# Patient Record
Sex: Male | Born: 1970 | Race: Black or African American | Hispanic: No | Marital: Single | Smoking: Current every day smoker
Health system: Southern US, Community
[De-identification: ages and names within clinical notes are randomized; demographics above are authoritative.]

## PROBLEM LIST (undated history)

## (undated) DIAGNOSIS — Z789 Other specified health status: Secondary | ICD-10-CM

## (undated) HISTORY — PX: ANKLE ARTHODESIS W/ ARTHROSCOPY: SUR63

## (undated) HISTORY — PX: ANKLE SURGERY: SHX546

---

## 2004-07-31 ENCOUNTER — Emergency Department (HOSPITAL_COMMUNITY): Admission: EM | Admit: 2004-07-31 | Discharge: 2004-08-01 | Payer: Self-pay | Admitting: Emergency Medicine

## 2008-07-25 ENCOUNTER — Inpatient Hospital Stay (HOSPITAL_COMMUNITY): Admission: EM | Admit: 2008-07-25 | Discharge: 2008-07-28 | Payer: Self-pay | Admitting: Emergency Medicine

## 2010-06-02 HISTORY — PX: ORIF ANKLE FRACTURE: SUR919

## 2010-09-17 LAB — APTT: aPTT: 27 seconds (ref 24–37)

## 2010-09-17 LAB — COMPREHENSIVE METABOLIC PANEL
ALT: 42 U/L (ref 0–53)
AST: 55 U/L — ABNORMAL HIGH (ref 0–37)
Albumin: 3.5 g/dL (ref 3.5–5.2)
CO2: 28 mEq/L (ref 19–32)
Calcium: 8.9 mg/dL (ref 8.4–10.5)
Chloride: 100 mEq/L (ref 96–112)
Creatinine, Ser: 0.84 mg/dL (ref 0.4–1.5)
GFR calc Af Amer: >60 mL/min (ref >60)
GFR calc non Af Amer: >60 mL/min (ref >60)
Sodium: 137 mEq/L (ref 135–145)
Total Bilirubin: 0.8 mg/dL (ref 0.3–1.2)

## 2010-09-17 LAB — CBC
MCV: 101.5 fL — ABNORMAL HIGH (ref 78.0–100.0)
Platelets: 157 10*3/uL (ref 150–400)
RBC: 3.6 MIL/uL — ABNORMAL LOW (ref 4.22–5.81)
WBC: 11.5 10*3/uL — ABNORMAL HIGH (ref 4.0–10.5)
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
## 2014-07-18 ENCOUNTER — Encounter (HOSPITAL_COMMUNITY): Payer: Self-pay | Admitting: Emergency Medicine

## 2014-07-18 ENCOUNTER — Emergency Department (HOSPITAL_COMMUNITY)
Admission: EM | Admit: 2014-07-18 | Discharge: 2014-07-19 | Disposition: A | Discharge status: Home / Self Care | Payer: Self-pay | Attending: Emergency Medicine | Admitting: Emergency Medicine

## 2014-07-18 ENCOUNTER — Emergency Department (HOSPITAL_COMMUNITY): Payer: Self-pay

## 2014-07-18 DIAGNOSIS — Y92009 Unspecified place in unspecified non-institutional (private) residence as the place of occurrence of the external cause: Secondary | ICD-10-CM | POA: Insufficient documentation

## 2014-07-18 DIAGNOSIS — S8252XA Displaced fracture of medial malleolus of left tibia, initial encounter for closed fracture: Secondary | ICD-10-CM | POA: Insufficient documentation

## 2014-07-18 DIAGNOSIS — Y998 Other external cause status: Secondary | ICD-10-CM | POA: Insufficient documentation

## 2014-07-18 DIAGNOSIS — W010XXA Fall on same level from slipping, tripping and stumbling without subsequent striking against object, initial encounter: Secondary | ICD-10-CM | POA: Insufficient documentation

## 2014-07-18 DIAGNOSIS — S9302XA Subluxation of left ankle joint, initial encounter: Secondary | ICD-10-CM | POA: Insufficient documentation

## 2014-07-18 DIAGNOSIS — Y9301 Activity, walking, marching and hiking: Secondary | ICD-10-CM | POA: Insufficient documentation

## 2014-07-18 DIAGNOSIS — Z720 Tobacco use: Secondary | ICD-10-CM | POA: Insufficient documentation

## 2014-07-18 DIAGNOSIS — S82892A Other fracture of left lower leg, initial encounter for closed fracture: Secondary | ICD-10-CM

## 2014-07-18 LAB — CBC WITH DIFFERENTIAL/PLATELET
BASOS ABS: 0 10*3/uL (ref 0.0–0.1)
Basophils Relative: 0 % (ref 0–1)
EOS ABS: 0.1 10*3/uL (ref 0.0–0.7)
EOS PCT: 1 % (ref 0–5)
HCT: 47.3 % (ref 39.0–52.0)
HEMOGLOBIN: 16.4 g/dL (ref 13.0–17.0)
LYMPHS ABS: 2.8 10*3/uL (ref 0.7–4.0)
Lymphocytes Relative: 27 % (ref 12–46)
MCH: 34.7 pg — AB (ref 26.0–34.0)
MCHC: 34.7 g/dL (ref 30.0–36.0)
MCV: 100.2 fL — ABNORMAL HIGH (ref 78.0–100.0)
Monocytes Absolute: 0.3 10*3/uL (ref 0.1–1.0)
Monocytes Relative: 3 % (ref 3–12)
NEUTROS PCT: 69 % (ref 43–77)
Neutro Abs: 7.3 10*3/uL (ref 1.7–7.7)
Platelets: 123 10*3/uL — ABNORMAL LOW (ref 150–400)
RBC: 4.72 MIL/uL (ref 4.22–5.81)
RDW: 14.4 % (ref 11.5–15.5)
WBC: 10.5 10*3/uL (ref 4.0–10.5)

## 2014-07-18 MED ORDER — MORPHINE SULFATE 4 MG/ML IJ SOLN: 4.0000 mg | Freq: Once | INTRAMUSCULAR | Status: DC
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
## 2014-07-19 ENCOUNTER — Emergency Department (HOSPITAL_COMMUNITY): Payer: Self-pay

## 2014-07-19 LAB — BASIC METABOLIC PANEL
ANION GAP: 12 (ref 5–15)
BUN: 7 mg/dL (ref 6–23)
CO2: 28 mmol/L (ref 19–32)
Calcium: 9.6 mg/dL (ref 8.4–10.5)
Chloride: 97 mmol/L (ref 96–112)
Creatinine, Ser: 0.8 mg/dL (ref 0.50–1.35)
Glucose, Bld: 92 mg/dL (ref 70–99)
POTASSIUM: 3.4 mmol/L — AB (ref 3.5–5.1)
SODIUM: 137 mmol/L (ref 135–145)

## 2014-07-19 LAB — ETHANOL: Alcohol, Ethyl (B): 309 mg/dL — ABNORMAL HIGH (ref 0–9)

## 2014-07-19 MED ORDER — OXYCODONE-ACETAMINOPHEN 5-325 MG PO TABS: 2.0000 | ORAL_TABLET | ORAL | Status: DC | PRN

## 2014-07-19 MED ORDER — ETOMIDATE 2 MG/ML IV SOLN
10.0000 mg | Freq: Once | INTRAVENOUS | Status: AC
  Administered 2014-07-19: 10 mg via INTRAVENOUS
  Filled 2014-07-19: qty 10

## 2014-07-19 NOTE — ED Notes (Signed)
Pt family called for pt. Family informed pt sleeping and they are welcome to pick pt up from ED.
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
## 2014-07-19 NOTE — Discharge Instructions (Signed)
Use crutches, do not put weight on her left leg.  Follow-up with Dr. Eulah Pont at his office either today, Wednesday or Friday.  Call the number listed above to find out best time to come to the office.  Take pain medicine as needed.  Keep leg elevated whenever possible.   Ankle Fracture A fracture is a break in a bone. The ankle joint is made up of three bones. These include the lower (distal)sections of your lower leg bones, called the tibia and fibula, along with a bone in your foot, called the talus. Depending on how bad the break is and if more than one ankle joint bone is broken, a cast or splint is used to protect and keep your injured bone from moving while it heals. Sometimes, surgery is required to help the fracture heal properly.  There are two general types of fractures:  Stable fracture. This includes a single fracture line through one bone, with no injury to ankle ligaments. A fracture of the talus that does not have any displacement (movement of the bone on either side of the fracture line) is also stable.  Unstable fracture. This includes more than one fracture line through one or more bones in the ankle joint. It also includes fractures that have displacement of the bone on either side of the fracture line. CAUSES  A direct blow to the ankle.   Quickly and severely twisting your ankle.  Trauma, such as a car accident or falling from a significant height. RISK FACTORS You may be at a higher risk of ankle fracture if:  You have certain medical conditions.  You are involved in high-impact sports.  You are involved in a high-impact car accident. SIGNS AND SYMPTOMS   Tender and swollen ankle.  Bruising around the injured ankle.  Pain on movement of the ankle.  Difficulty walking or putting weight on the ankle.  A cold foot below the site of the ankle injury. This can occur if the blood vessels passing through your injured ankle were also damaged.  Numbness in the foot  below the site of the ankle injury. DIAGNOSIS  An ankle fracture is usually diagnosed with a physical exam and X-rays. A CT scan may also be required for complex fractures. TREATMENT  Stable fractures are treated with a cast or splint and using crutches to avoid putting weight on your injured ankle. This is followed by an ankle strengthening program. Some patients require a special type of cast, depending on other medical problems they may have. Unstable fractures require surgery to ensure the bones heal properly. Your health care provider will tell you what type of fracture you have and the best treatment for your condition. HOME CARE INSTRUCTIONS   Review correct crutch use with your health care provider and use your crutches as directed. Safe use of crutches is extremely important. Misuse of crutches can cause you to fall or cause injury to nerves in your hands or armpits.  Do not put weight or pressure on the injured ankle until directed by your health care provider.  To lessen the swelling, keep the injured leg elevated while sitting or lying down.  Apply ice to the injured area:  Put ice in a plastic bag.  Place a towel between your cast and the bag.  Leave the ice on for 20 minutes, 2-3 times a day.  If you have a plaster or fiberglass cast:  Do not try to scratch the skin under the cast with any objects.  This can increase your risk of skin infection.  Check the skin around the cast every day. You may put lotion on any red or sore areas.  Keep your cast dry and clean.  If you have a plaster splint:  Wear the splint as directed.  You may loosen the elastic around the splint if your toes become numb, tingle, or turn cold or blue.  Do not put pressure on any part of your cast or splint; it may break. Rest your cast only on a pillow the first 24 hours until it is fully hardened.  Your cast or splint can be protected during bathing with a plastic bag sealed to your skin with  medical tape. Do not lower the cast or splint into water.  Take medicines as directed by your health care provider. Only take over-the-counter or prescription medicines for pain, discomfort, or fever as directed by your health care provider.  Do not drive a vehicle until your health care provider specifically tells you it is safe to do so.  If your health care provider has given you a follow-up appointment, it is very important to keep that appointment. Not keeping the appointment could result in a chronic or permanent injury, pain, and disability. If you have any problem keeping the appointment, call the facility for assistance. SEEK MEDICAL CARE IF: You develop increased swelling or discomfort. SEEK IMMEDIATE MEDICAL CARE IF:   Your cast gets damaged or breaks.  You have continued severe pain.  You develop new pain or swelling after the cast was put on.  Your skin or toenails below the injury turn blue or gray.  Your skin or toenails below the injury feel cold, numb, or have loss of sensitivity to touch.  There is a bad smell or pus draining from under the cast. MAKE SURE YOU:   Understand these instructions.  Will watch your condition.  Will get help right away if you are not doing well or get worse. Document Released: 05/16/2000 Document Revised: 05/24/2013 Document Reviewed: 12/16/2012 [facility] Patient Information 2015 [place], [place]. This information is not intended to replace advice given to you by your health care provider. Make sure you discuss any questions you have with your health care provider.  Cast or Splint Care Casts and splints support injured limbs and keep bones from moving while they heal. It is important to care for your cast or splint at home.  HOME CARE INSTRUCTIONS  Keep the cast or splint uncovered during the drying period. It can take 24 to 48 hours to dry if it is made of plaster. A fiberglass cast will dry in less than 1 hour.  Do not rest the cast  on anything harder than a pillow for the first 24 hours.  Do not put weight on your injured limb or apply pressure to the cast until your health care provider gives you permission.  Keep the cast or splint dry. Wet casts or splints can lose their shape and may not support the limb as well. A wet cast that has lost its shape can also create harmful pressure on your skin when it dries. Also, wet skin can become infected.  Cover the cast or splint with a plastic bag when bathing or when out in the rain or snow. If the cast is on the trunk of the body, take sponge baths until the cast is removed.  If your cast does become wet, dry it with a towel or a blow dryer on the cool  setting only.  Keep your cast or splint clean. Soiled casts may be wiped with a moistened cloth.  Do not place any hard or soft foreign objects under your cast or splint, such as cotton, toilet paper, lotion, or powder.  Do not try to scratch the skin under the cast with any object. The object could get stuck inside the cast. Also, scratching could lead to an infection. If itching is a problem, use a blow dryer on a cool setting to relieve discomfort.  Do not trim or cut your cast or remove padding from inside of it.  Exercise all joints next to the injury that are not immobilized by the cast or splint. For example, if you have a long leg cast, exercise the hip joint and toes. If you have an arm cast or splint, exercise the shoulder, elbow, thumb, and fingers.  Elevate your injured arm or leg on 1 or 2 pillows for the first 1 to 3 days to decrease swelling and pain.It is best if you can comfortably elevate your cast so it is higher than your heart. SEEK MEDICAL CARE IF:   Your cast or splint cracks.  Your cast or splint is too tight or too loose.  You have unbearable itching inside the cast.  Your cast becomes wet or develops a soft spot or area.  You have a bad smell coming from inside your cast.  You get an object  stuck under your cast.  Your skin around the cast becomes red or raw.  You have new pain or worsening pain after the cast has been applied. SEEK IMMEDIATE MEDICAL CARE IF:   You have fluid leaking through the cast.  You are unable to move your fingers or toes.  You have discolored (blue or white), cool, painful, or very swollen fingers or toes beyond the cast.  You have tingling or numbness around the injured area.  You have severe pain or pressure under the cast.  You have any difficulty with your breathing or have shortness of breath.  You have chest pain. Document Released: 05/16/2000 Document Revised: 03/09/2013 Document Reviewed: 11/25/2012 [facility] Patient Information 2015 [place]. This information is not intended to replace advice given to you by your health care provider. Make sure you discuss any questions you have with your health care provider.

## 2014-07-19 NOTE — ED Notes (Signed)
Pt awake alert and oriented 

## 2014-07-24 ENCOUNTER — Encounter (HOSPITAL_BASED_OUTPATIENT_CLINIC_OR_DEPARTMENT_OTHER): Payer: Self-pay | Admitting: *Deleted

## 2014-07-24 NOTE — Progress Notes (Signed)
No labs needed
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
## 2014-07-27 ENCOUNTER — Ambulatory Visit (HOSPITAL_BASED_OUTPATIENT_CLINIC_OR_DEPARTMENT_OTHER)
Admission: RE | Admit: 2014-07-27 | Discharge: 2014-07-27 | Disposition: A | Discharge status: Home / Self Care | Payer: Self-pay | Source: Ambulatory Visit | Attending: Orthopedic Surgery | Admitting: Orthopedic Surgery

## 2014-07-27 ENCOUNTER — Ambulatory Visit (HOSPITAL_BASED_OUTPATIENT_CLINIC_OR_DEPARTMENT_OTHER): Payer: Self-pay | Admitting: Anesthesiology

## 2014-07-27 ENCOUNTER — Encounter (HOSPITAL_BASED_OUTPATIENT_CLINIC_OR_DEPARTMENT_OTHER): Payer: Self-pay | Admitting: *Deleted

## 2014-07-27 ENCOUNTER — Encounter (HOSPITAL_BASED_OUTPATIENT_CLINIC_OR_DEPARTMENT_OTHER)
Admission: RE | Disposition: A | Discharge status: Home / Self Care | Payer: Self-pay | Source: Ambulatory Visit | Attending: Orthopedic Surgery

## 2014-07-27 DIAGNOSIS — Y929 Unspecified place or not applicable: Secondary | ICD-10-CM | POA: Insufficient documentation

## 2014-07-27 DIAGNOSIS — F17200 Nicotine dependence, unspecified, uncomplicated: Secondary | ICD-10-CM | POA: Insufficient documentation

## 2014-07-27 DIAGNOSIS — S8252XA Displaced fracture of medial malleolus of left tibia, initial encounter for closed fracture: Secondary | ICD-10-CM | POA: Insufficient documentation

## 2014-07-27 DIAGNOSIS — S82852D Displaced trimalleolar fracture of left lower leg, subsequent encounter for closed fracture with routine healing: Secondary | ICD-10-CM

## 2014-07-27 DIAGNOSIS — X58XXXA Exposure to other specified factors, initial encounter: Secondary | ICD-10-CM | POA: Insufficient documentation

## 2014-07-27 DIAGNOSIS — Y9389 Activity, other specified: Secondary | ICD-10-CM | POA: Insufficient documentation

## 2014-07-27 DIAGNOSIS — Y999 Unspecified external cause status: Secondary | ICD-10-CM | POA: Insufficient documentation

## 2014-07-27 DIAGNOSIS — S82853A Displaced trimalleolar fracture of unspecified lower leg, initial encounter for closed fracture: Secondary | ICD-10-CM | POA: Diagnosis present

## 2014-07-27 HISTORY — PX: ORIF ANKLE FRACTURE: SHX5408

## 2014-07-27 HISTORY — DX: Other specified health status: Z78.9

## 2014-07-27 SURGERY — OPEN REDUCTION INTERNAL FIXATION (ORIF) ANKLE FRACTURE
Anesthesia: General | Site: Ankle | Laterality: Left

## 2014-07-27 MED ORDER — ASPIRIN EC 81 MG PO TBEC: 81.0000 mg | DELAYED_RELEASE_TABLET | Freq: Every day | ORAL | Status: DC

## 2014-07-27 MED ORDER — ONDANSETRON HCL 4 MG PO TABS: 4.0000 mg | ORAL_TABLET | Freq: Three times a day (TID) | ORAL | Status: DC | PRN

## 2014-07-27 MED ORDER — ACETAMINOPHEN 500 MG PO TABS
1000.0000 mg | ORAL_TABLET | Freq: Once | ORAL | Status: AC
  Administered 2014-07-27: 1000 mg via ORAL

## 2014-07-27 MED ORDER — FENTANYL CITRATE 0.05 MG/ML IJ SOLN
INTRAMUSCULAR | Status: AC
  Filled 2014-07-27: qty 2

## 2014-07-27 MED ORDER — CEFAZOLIN SODIUM-DEXTROSE 2-3 GM-% IV SOLR
INTRAVENOUS | Status: AC
  Filled 2014-07-27: qty 50

## 2014-07-27 MED ORDER — MIDAZOLAM HCL 2 MG/2ML IJ SOLN
1.0000 mg | INTRAMUSCULAR | Status: DC | PRN
  Administered 2014-07-27: 2 mg via INTRAVENOUS

## 2014-07-27 MED ORDER — DOCUSATE SODIUM 100 MG PO CAPS: 100.0000 mg | ORAL_CAPSULE | Freq: Every day | ORAL | Status: DC | PRN

## 2014-07-27 MED ORDER — OXYCODONE-ACETAMINOPHEN 5-325 MG PO TABS: 1.0000 | ORAL_TABLET | ORAL | Status: DC | PRN

## 2014-07-27 MED ORDER — LACTATED RINGERS IV SOLN
INTRAVENOUS | Status: DC
  Administered 2014-07-27: 07:00:00 via INTRAVENOUS
  Administered 2014-07-27: 10 mL/h via INTRAVENOUS
  Administered 2014-07-27: 09:00:00 via INTRAVENOUS

## 2014-07-27 MED ORDER — DEXAMETHASONE SODIUM PHOSPHATE 10 MG/ML IJ SOLN
INTRAMUSCULAR | Status: DC | PRN
  Administered 2014-07-27: 10 mg via INTRAVENOUS

## 2014-07-27 MED ORDER — LIDOCAINE HCL (CARDIAC) 20 MG/ML IV SOLN
INTRAVENOUS | Status: DC | PRN
  Administered 2014-07-27: 60 mg via INTRAVENOUS

## 2014-07-27 MED ORDER — OXYCODONE HCL 5 MG PO TABS
ORAL_TABLET | ORAL | Status: AC
  Filled 2014-07-27: qty 1

## 2014-07-27 MED ORDER — CEFAZOLIN SODIUM-DEXTROSE 2-3 GM-% IV SOLR
2.0000 g | INTRAVENOUS | Status: AC
  Administered 2014-07-27: 2 g via INTRAVENOUS

## 2014-07-27 MED ORDER — PROPOFOL 10 MG/ML IV BOLUS
INTRAVENOUS | Status: DC | PRN
  Administered 2014-07-27: 180 mg via INTRAVENOUS

## 2014-07-27 MED ORDER — HYDRALAZINE HCL 20 MG/ML IJ SOLN
20.0000 mg | Freq: Four times a day (QID) | INTRAMUSCULAR | Status: DC | PRN
  Administered 2014-07-27: 10 mg via INTRAVENOUS

## 2014-07-27 MED ORDER — ACETAMINOPHEN 500 MG PO TABS
ORAL_TABLET | ORAL | Status: AC
  Filled 2014-07-27: qty 2

## 2014-07-27 MED ORDER — KETOROLAC TROMETHAMINE 30 MG/ML IJ SOLN: 30.0000 mg | Freq: Once | INTRAMUSCULAR | Status: DC | PRN

## 2014-07-27 MED ORDER — MIDAZOLAM HCL 2 MG/2ML IJ SOLN
INTRAMUSCULAR | Status: AC
  Filled 2014-07-27: qty 2

## 2014-07-27 MED ORDER — METHOCARBAMOL 500 MG PO TABS: 500.0000 mg | ORAL_TABLET | Freq: Four times a day (QID) | ORAL | Status: DC

## 2014-07-27 MED ORDER — DEXTROSE-NACL 5-0.45 % IV SOLN: INTRAVENOUS | Status: DC

## 2014-07-27 MED ORDER — DOCUSATE SODIUM 100 MG PO CAPS: 100.0000 mg | ORAL_CAPSULE | Freq: Two times a day (BID) | ORAL | Status: DC

## 2014-07-27 MED ORDER — PROPOFOL 10 MG/ML IV BOLUS
INTRAVENOUS | Status: AC
  Filled 2014-07-27: qty 40

## 2014-07-27 MED ORDER — FENTANYL CITRATE 0.05 MG/ML IJ SOLN
50.0000 ug | INTRAMUSCULAR | Status: DC | PRN
  Administered 2014-07-27: 100 ug via INTRAVENOUS

## 2014-07-27 MED ORDER — PROMETHAZINE HCL 25 MG/ML IJ SOLN: 6.2500 mg | INTRAMUSCULAR | Status: DC | PRN

## 2014-07-27 MED ORDER — HYDRALAZINE HCL 20 MG/ML IJ SOLN
INTRAMUSCULAR | Status: AC
  Filled 2014-07-27: qty 1

## 2014-07-27 MED ORDER — FENTANYL CITRATE 0.05 MG/ML IJ SOLN
25.0000 ug | INTRAMUSCULAR | Status: DC | PRN
  Administered 2014-07-27 (×2): 25 ug via INTRAVENOUS
  Administered 2014-07-27: 50 ug via INTRAVENOUS

## 2014-07-27 MED ORDER — CHLORHEXIDINE GLUCONATE 4 % EX LIQD: 60.0000 mL | Freq: Once | CUTANEOUS | Status: DC

## 2014-07-27 MED ORDER — OXYCODONE HCL 5 MG PO TABS
5.0000 mg | ORAL_TABLET | Freq: Once | ORAL | Status: AC
Start: 2014-07-27 — End: 2014-07-27
  Administered 2014-07-27: 5 mg via ORAL

## 2014-07-27 MED ORDER — FENTANYL CITRATE 0.05 MG/ML IJ SOLN
INTRAMUSCULAR | Status: AC
  Filled 2014-07-27: qty 4

## 2014-07-27 MED ORDER — ONDANSETRON HCL 4 MG/2ML IJ SOLN
INTRAMUSCULAR | Status: DC | PRN
  Administered 2014-07-27: 4 mg via INTRAVENOUS

## 2014-07-27 MED ORDER — FENTANYL CITRATE 0.05 MG/ML IJ SOLN
INTRAMUSCULAR | Status: DC | PRN
  Administered 2014-07-27: 25 ug via INTRAVENOUS
  Administered 2014-07-27: 50 ug via INTRAVENOUS
  Administered 2014-07-27: 25 ug via INTRAVENOUS

## 2014-07-27 SURGICAL SUPPLY — 76 items
BANDAGE ELASTIC 4 VELCRO ST LF (GAUZE/BANDAGES/DRESSINGS) ×3 IMPLANT
BANDAGE ELASTIC 6 VELCRO ST LF (GAUZE/BANDAGES/DRESSINGS) ×3 IMPLANT
BANDAGE ESMARK 6X9 LF (GAUZE/BANDAGES/DRESSINGS) ×1 IMPLANT
BIT DRILL 2.5X125 (BIT) ×2 IMPLANT
BIT DRILL 3.5X125 (BIT) IMPLANT
BIT DRILL CANN 2.7 (BIT) ×2
BIT DRILL CANN 2.7MM (BIT) ×1
BIT DRILL COUNTER SINK (DRILL) IMPLANT
BIT DRILL SRG 2.7XCANN AO CPLG (BIT) IMPLANT
BIT DRL SRG 2.7XCANN AO CPLNG (BIT) ×1
BLADE SURG 15 STRL LF DISP TIS (BLADE) ×2 IMPLANT
BLADE SURG 15 STRL SS (BLADE) ×6
BNDG CMPR 9X6 STRL LF SNTH (GAUZE/BANDAGES/DRESSINGS) ×1
BNDG COHESIVE 4X5 TAN STRL (GAUZE/BANDAGES/DRESSINGS) ×3 IMPLANT
BNDG ESMARK 6X9 LF (GAUZE/BANDAGES/DRESSINGS) ×3
CHLORAPREP W/TINT 26ML (MISCELLANEOUS) ×3 IMPLANT
CLOSURE STERI-STRIP 1/2X4 (GAUZE/BANDAGES/DRESSINGS) ×2
CLSR STERI-STRIP ANTIMIC 1/2X4 (GAUZE/BANDAGES/DRESSINGS) ×3 IMPLANT
COVER BACK TABLE 60X90IN (DRAPES) ×3 IMPLANT
CUFF TOURNIQUET SINGLE 24IN (TOURNIQUET CUFF) IMPLANT
CUFF TOURNIQUET SINGLE 34IN LL (TOURNIQUET CUFF) ×2 IMPLANT
DECANTER SPIKE VIAL GLASS SM (MISCELLANEOUS) IMPLANT
DRAPE EXTREMITY T 121X128X90 (DRAPE) ×3 IMPLANT
DRAPE OEC MINIVIEW 54X84 (DRAPES) ×3 IMPLANT
DRAPE U 20/CS (DRAPES) ×3 IMPLANT
DRAPE U-SHAPE 47X51 STRL (DRAPES) ×3 IMPLANT
DRILL BIT 3.5X125 (BIT) ×3
DRILL COUNTER SINK (DRILL) ×3
DRSG EMULSION OIL 3X3 NADH (GAUZE/BANDAGES/DRESSINGS) ×1 IMPLANT
DRSG PAD ABDOMINAL 8X10 ST (GAUZE/BANDAGES/DRESSINGS) ×4 IMPLANT
ELECT REM PT RETURN 9FT ADLT (ELECTROSURGICAL) ×3
ELECTRODE REM PT RTRN 9FT ADLT (ELECTROSURGICAL) ×1 IMPLANT
GAUZE SPONGE 4X4 12PLY STRL (GAUZE/BANDAGES/DRESSINGS) ×3 IMPLANT
GLOVE BIO SURGEON STRL SZ7.5 (GLOVE) ×3 IMPLANT
GLOVE BIO SURGEON STRL SZ8 (GLOVE) IMPLANT
GLOVE BIOGEL PI IND STRL 8 (GLOVE) ×1 IMPLANT
GLOVE BIOGEL PI INDICATOR 8 (GLOVE) ×2
GOWN STRL REUS W/ TWL LRG LVL3 (GOWN DISPOSABLE) ×3 IMPLANT
GOWN STRL REUS W/ TWL XL LVL3 (GOWN DISPOSABLE) IMPLANT
GOWN STRL REUS W/TWL LRG LVL3 (GOWN DISPOSABLE) ×9
GOWN STRL REUS W/TWL XL LVL3 (GOWN DISPOSABLE)
K-WIRE ORTHOPEDIC 1.4X150L (WIRE) ×6
KWIRE ORTHOPEDIC 1.4X150L (WIRE) IMPLANT
NEEDLE HYPO 22GX1.5 SAFETY (NEEDLE) IMPLANT
NS IRRIG 1000ML POUR BTL (IV SOLUTION) ×3 IMPLANT
PACK BASIN DAY SURGERY FS (CUSTOM PROCEDURE TRAY) ×3 IMPLANT
PAD CAST 4YDX4 CTTN HI CHSV (CAST SUPPLIES) ×1 IMPLANT
PADDING CAST COTTON 4X4 STRL (CAST SUPPLIES) ×3
PADDING CAST COTTON 6X4 STRL (CAST SUPPLIES) ×3 IMPLANT
PENCIL BUTTON HOLSTER BLD 10FT (ELECTRODE) ×3 IMPLANT
PLATE TUBULAR 8 HOLE 1/3 (Plate) ×2 IMPLANT
SCREW CANC FT 16X4X2.5XHEX (Screw) IMPLANT
SCREW CANCELLOUS 4.0X14 (Screw) ×2 IMPLANT
SCREW CANCELLOUS 4.0X16MM (Screw) ×3 IMPLANT
SCREW CANN 44X15X4XSLF DRL (Screw) IMPLANT
SCREW CANNULATED 4.0X44MM (Screw) ×6 IMPLANT
SCREW CORTEX ST MATTA 3.5X12MM (Screw) ×6 IMPLANT
SCREW CORTEX ST MATTA 3.5X16MM (Screw) ×2 IMPLANT
SCREW CORTEX ST MATTA 3.5X18MM (Screw) ×4 IMPLANT
SLEEVE SCD COMPRESS KNEE MED (MISCELLANEOUS) ×2 IMPLANT
SPLINT FAST PLASTER 5X30 (CAST SUPPLIES)
SPLINT PLASTER CAST FAST 5X30 (CAST SUPPLIES) IMPLANT
SPONGE LAP 4X18 X RAY DECT (DISPOSABLE) ×3 IMPLANT
SUCTION FRAZIER TIP 10 FR DISP (SUCTIONS) IMPLANT
SUT MON AB 2-0 CT1 36 (SUTURE) ×3 IMPLANT
SUT MON AB 4-0 PC3 18 (SUTURE) ×3 IMPLANT
SUT VIC AB 0 SH 27 (SUTURE) ×5 IMPLANT
SUT VIC AB 2-0 SH 27 (SUTURE)
SUT VIC AB 2-0 SH 27XBRD (SUTURE) IMPLANT
SYR BULB 3OZ (MISCELLANEOUS) ×3 IMPLANT
SYR CONTROL 10ML LL (SYRINGE) IMPLANT
TOWEL OR 17X24 6PK STRL BLUE (TOWEL DISPOSABLE) ×6 IMPLANT
TOWEL OR NON WOVEN STRL DISP B (DISPOSABLE) ×3 IMPLANT
TUBE CONNECTING 20'X1/4 (TUBING) ×1
TUBE CONNECTING 20X1/4 (TUBING) ×2 IMPLANT
UNDERPAD 30X30 INCONTINENT (UNDERPADS AND DIAPERS) ×3 IMPLANT

## 2014-07-27 NOTE — Transfer of Care (Signed)
Immediate Anesthesia Transfer of Care Note  Patient: Douglas Snyder  Procedure(s) Performed: Procedure(s): OPEN REDUCTION INTERNAL FIXATION (ORIF) LEFT ANKLE FRACTURE (Left)  Patient Location: PACU  Anesthesia Type:General  Level of Consciousness: sedated  Airway & Oxygen Therapy: Patient Spontanous Breathing and Patient connected to face mask oxygen  Post-op Assessment: Report given to RN and Post -op Vital signs reviewed and stable  Post vital signs: Reviewed and stable  Last Vitals:  Filed Vitals:   07/27/14 0706  BP:   Pulse: 81  Temp:   Resp: 18    Complications: No apparent anesthesia complications

## 2014-07-27 NOTE — Anesthesia Procedure Notes (Addendum)
Anesthesia Regional Block:  Popliteal block  Pre-Anesthetic Checklist: ,, timeout performed, Correct Patient, Correct Site, Correct Laterality, Correct Procedure, Correct Position, site marked, Risks and benefits discussed,  Surgical consent,  Pre-op evaluation,  At surgeon's request and post-op pain management  Laterality: Left  Prep: chloraprep       Needles:  Injection technique: Single-shot  Needle Type: Echogenic Stimulator Needle     Needle Length: 9cm 9 cm Needle Gauge: 21 and 21 G    Additional Needles:  Procedures: ultrasound guided (picture in chart) Popliteal block Narrative:  Injection made incrementally with aspirations every 5 mL.  Performed by: Personally   Additional Notes: Patient tolerated the procedure well without complications   Procedure Name: LMA Insertion Date/Time: 07/27/2014 8:31 AM Performed by: Burna CashONRAD, Christina Waldrop C Pre-anesthesia Checklist: Patient identified, Emergency Drugs available, Suction available and Patient being monitored Patient Re-evaluated:Patient Re-evaluated prior to inductionOxygen Delivery Method: Circle System Utilized Preoxygenation: Pre-oxygenation with 100% oxygen Intubation Type: IV induction Ventilation: Mask ventilation without difficulty LMA: LMA inserted LMA Size: 5.0 Number of attempts: 1 Airway Equipment and Method: Bite block Placement Confirmation: positive ETCO2 Tube secured with: Tape Dental Injury: Teeth and Oropharynx as per pre-operative assessment

## 2014-07-27 NOTE — Discharge Instructions (Signed)
Non-weight bearing in the left leg  Keep splint clean and dry till follow up appointment  Take ASA 81mg  daily for 3 weeks post op for DVT prophylaxis    Post Anesthesia Home Care Instructions  Activity: Get plenty of rest for the remainder of the day. A responsible adult should stay with you for 24 hours following the procedure.  For the next 24 hours, DO NOT: -Drive a car -Advertising copywriterperate machinery -Drink alcoholic beverages -Take any medication unless instructed by your physician -Make any legal decisions or sign important papers.  Meals: Start with liquid foods such as gelatin or soup. Progress to regular foods as tolerated. Avoid greasy, spicy, heavy foods. If nausea and/or vomiting occur, drink only clear liquids until the nausea and/or vomiting subsides. Call your physician if vomiting continues.  Special Instructions/Symptoms: Your throat may feel dry or sore from the anesthesia or the breathing tube placed in your throat during surgery. If this causes discomfort, gargle with warm salt water. The discomfort should disappear within 24 hours.  Regional Anesthesia Blocks  1. Numbness or the inability to move the "blocked" extremity may last from 3-48 hours after placement. The length of time depends on the medication injected and your individual response to the medication. If the numbness is not going away after 48 hours, call your surgeon.  2. The extremity that is blocked will need to be protected until the numbness is gone and the  Strength has returned. Because you cannot feel it, you will need to take extra care to avoid injury. Because it may be weak, you may have difficulty moving it or using it. You may not know what position it is in without looking at it while the block is in effect.  3. For blocks in the legs and feet, returning to weight bearing and walking needs to be done carefully. You will need to wait until the numbness is entirely gone and the strength has returned. You  should be able to move your leg and foot normally before you try and bear weight or walk. You will need someone to be with you when you first try to ensure you do not fall and possibly risk injury.  4. Bruising and tenderness at the needle site are common side effects and will resolve in a few days.  5. Persistent numbness or new problems with movement should be communicated to the surgeon or the [facility] [phone])/ [facility] [phone]).

## 2014-07-27 NOTE — Op Note (Signed)
07/27/2014  8:42 AM  PATIENT:  Douglas Snyder    PRE-OPERATIVE DIAGNOSIS:  LEFT ANKLE FRACTURE  POST-OPERATIVE DIAGNOSIS:  Same  PROCEDURE:  OPEN REDUCTION INTERNAL FIXATION (ORIF) LEFT ANKLE FRACTURE  SURGEON:  Abiha Lukehart, D, MD  ASSISTANT: Janalee DaneBrittney Kelly, PA-C, She was present and scrubbed throughout the case, critical for completion in a timely fashion, and for retraction, instrumentation, and closure.   ANESTHESIA:   gen and block  PREOPERATIVE INDICATIONS:  Douglas Snyder is a  44 y.o. male with a diagnosis of LEFT ANKLE FRACTURE who failed conservative measures and elected for surgical management.    The risks benefits and alternatives were discussed with the patient preoperatively including but not limited to the risks of infection, bleeding, nerve injury, cardiopulmonary complications, the need for revision surgery, among others, and the patient was willing to proceed.  OPERATIVE IMPLANTS: 1/3 tubular stainless and a 4.0 screw  OPERATIVE FINDINGS: Unstable ankle fracture. Stable syndesmosis post op  BLOOD LOSS: min  COMPLICATIONS: none  TOURNIQUET TIME: 60  OPERATIVE PROCEDURE:  Patient was identified in the preoperative holding area and site was marked by me He was transported to the operating theater and placed on the table in supine position taking care to pad all bony prominences. After a preincinduction time out anesthesia was induced. The left lower lower extremity was prepped and draped in normal sterile fashion and a pre-incision timeout was performed. Douglas Snyder received ancef for preoperative antibiotics.   I made a lateral incision of roughly 10 cm dissection was carried down sharply to the distal fibula and then spreading dissection was used proximally to protect the superficial peroneal nerve. I sharply incised the periosteum and took care to protect the peroneal tendons. I then debrided the fracture site and performed a reduction maneuver  which was held in place with a clamp.   I placed a lag screws across the fractures  I then selected a 8-hole one third tubular plate and placed in a neutralization fashion care was taken distally so as not to penetrate the joint with the cancellus screws.  I then turned my attention medially where I created a 4 cm incision and dissected sharply down to the medial Mal fracture taking care to protect the saphenous vein. I debrided the fracture and reduced and held in place with a tenaculum. I then drilled and placed 2 partially threaded 45 mm cannulated screws one anterior and one posterior across the fracture.  I then stressed the syndesmosis and it was stable.  The small posterior mal piece did not require fixation  The wound was then thoroughly irrigated and closed using a 0 Vicryl and absorbable Monocryl sutures. He was placed in a short leg splint.   POST OPERATIVE PLAN: Non-weightbearing. DVT prophylaxis will consist of ASA 81 daily  This note was generated using a template and dragon dictation system. In light of that, I have reviewed the note and all aspects of it are applicable to this case. Any dictation errors are due to the computerized dictation system.

## 2014-07-27 NOTE — Interval H&P Note (Signed)
History and Physical Interval Note:  07/27/2014 7:17 AM  Douglas Snyder  has presented today for surgery, with the diagnosis of LEFT ANKLE FRACTURE  The various methods of treatment have been discussed with the patient and family. After consideration of risks, benefits and other options for treatment, the patient has consented to  Procedure(s): OPEN REDUCTION INTERNAL FIXATION (ORIF) LEFT ANKLE FRACTURE (Left) as a surgical intervention .  The patient's history has been reviewed, patient examined, no change in status, stable for surgery.  I have reviewed the patient's chart and labs.  Questions were answered to the patient's satisfaction.     Alyssabeth Bruster, D

## 2014-07-27 NOTE — Progress Notes (Signed)
Assisted Dr. Rose with left, ultrasound guided, popliteal block. Side rails up, monitors on throughout procedure. See vital signs in flow sheet. Tolerated Procedure well. 

## 2014-07-27 NOTE — Anesthesia Postprocedure Evaluation (Signed)
  Anesthesia Post-op Note  Patient: Douglas Snyder  Procedure(s) Performed: Procedure(s) (LRB): OPEN REDUCTION INTERNAL FIXATION (ORIF) LEFT ANKLE FRACTURE (Left)  Patient Location: PACU  Anesthesia Type: GA combined with regional for post-op pain  Level of Consciousness: awake and alert   Airway and Oxygen Therapy: Patient Spontanous Breathing  Post-op Pain: mild  Post-op Assessment: Post-op Vital signs reviewed, Patient's Cardiovascular Status Stable, Respiratory Function Stable, Patent Airway and No signs of Nausea or vomiting  Last Vitals:  Filed Vitals:   07/27/14 1000  BP: 184/110  Pulse: 95  Temp:   Resp: 17    Post-op Vital Signs: stable   Complications: No apparent anesthesia complications

## 2014-07-27 NOTE — Anesthesia Preprocedure Evaluation (Addendum)
Anesthesia Evaluation  Patient identified by MRN, date of birth, ID band Patient awake    Reviewed: Allergy & Precautions, NPO status , Patient's Chart, lab work & pertinent test results  History of Anesthesia Complications Negative for: history of anesthetic complications  Airway Mallampati: II  TM Distance: >3 FB Neck ROM: Full    Dental no notable dental hx.    Pulmonary Current Smoker,  breath sounds clear to auscultation  Pulmonary exam normal       Cardiovascular negative cardio ROS  Rhythm:Regular Rate:Normal     Neuro/Psych negative neurological ROS  negative psych ROS   GI/Hepatic negative GI ROS, Neg liver ROS,   Endo/Other  negative endocrine ROS  Renal/GU negative Renal ROS  negative genitourinary   Musculoskeletal negative musculoskeletal ROS (+)   Abdominal   Peds negative pediatric ROS (+)  Hematology negative hematology ROS (+)   Anesthesia Other Findings   Reproductive/Obstetrics negative OB ROS                            Anesthesia Physical Anesthesia Plan  ASA: II  Anesthesia Plan: General   Post-op Pain Management:    Induction: Intravenous  Airway Management Planned: LMA  Additional Equipment:   Intra-op Plan:   Post-operative Plan: Extubation in OR  Informed Consent: I have reviewed the patients History and Physical, chart, labs and discussed the procedure including the risks, benefits and alternatives for the proposed anesthesia with the patient or authorized representative who has indicated his/her understanding and acceptance.   Dental advisory given  Plan Discussed with: CRNA and Surgeon  Anesthesia Plan Comments:         Anesthesia Quick Evaluation

## 2014-07-31 ENCOUNTER — Encounter (HOSPITAL_BASED_OUTPATIENT_CLINIC_OR_DEPARTMENT_OTHER): Payer: Self-pay | Admitting: Orthopedic Surgery

## 2014-07-31 LAB — POCT HEMOGLOBIN-HEMACUE: Hemoglobin: 15.7 g/dL (ref 13.0–17.0)

## 2020-09-08 ENCOUNTER — Other Ambulatory Visit: Payer: Self-pay

## 2020-09-08 ENCOUNTER — Emergency Department (HOSPITAL_COMMUNITY): Payer: No Typology Code available for payment source

## 2020-09-08 ENCOUNTER — Encounter (HOSPITAL_COMMUNITY): Payer: Self-pay

## 2020-09-08 ENCOUNTER — Inpatient Hospital Stay (HOSPITAL_COMMUNITY)
Admission: EM | Admit: 2020-09-08 | Discharge: 2020-09-13 | DRG: 481 | Disposition: A | Discharge status: Home / Self Care | Payer: No Typology Code available for payment source | Attending: General Surgery | Admitting: General Surgery

## 2020-09-08 DIAGNOSIS — D62 Acute posthemorrhagic anemia: Secondary | ICD-10-CM | POA: Diagnosis not present

## 2020-09-08 DIAGNOSIS — M549 Dorsalgia, unspecified: Secondary | ICD-10-CM | POA: Diagnosis not present

## 2020-09-08 DIAGNOSIS — R52 Pain, unspecified: Secondary | ICD-10-CM

## 2020-09-08 DIAGNOSIS — Z23 Encounter for immunization: Secondary | ICD-10-CM

## 2020-09-08 DIAGNOSIS — F17210 Nicotine dependence, cigarettes, uncomplicated: Secondary | ICD-10-CM | POA: Diagnosis present

## 2020-09-08 DIAGNOSIS — S7290XA Unspecified fracture of unspecified femur, initial encounter for closed fracture: Secondary | ICD-10-CM | POA: Diagnosis present

## 2020-09-08 DIAGNOSIS — Z20822 Contact with and (suspected) exposure to covid-19: Secondary | ICD-10-CM | POA: Diagnosis present

## 2020-09-08 DIAGNOSIS — D696 Thrombocytopenia, unspecified: Secondary | ICD-10-CM | POA: Diagnosis not present

## 2020-09-08 DIAGNOSIS — T148XXA Other injury of unspecified body region, initial encounter: Secondary | ICD-10-CM

## 2020-09-08 DIAGNOSIS — S2243XS Multiple fractures of ribs, bilateral, sequela: Secondary | ICD-10-CM

## 2020-09-08 DIAGNOSIS — R413 Other amnesia: Secondary | ICD-10-CM | POA: Diagnosis present

## 2020-09-08 DIAGNOSIS — S2243XA Multiple fractures of ribs, bilateral, initial encounter for closed fracture: Secondary | ICD-10-CM | POA: Diagnosis present

## 2020-09-08 DIAGNOSIS — S72352A Displaced comminuted fracture of shaft of left femur, initial encounter for closed fracture: Secondary | ICD-10-CM

## 2020-09-08 DIAGNOSIS — S7222XA Displaced subtrochanteric fracture of left femur, initial encounter for closed fracture: Principal | ICD-10-CM | POA: Diagnosis present

## 2020-09-08 DIAGNOSIS — Y92410 Unspecified street and highway as the place of occurrence of the external cause: Secondary | ICD-10-CM

## 2020-09-08 DIAGNOSIS — T1490XA Injury, unspecified, initial encounter: Secondary | ICD-10-CM

## 2020-09-08 LAB — COMPREHENSIVE METABOLIC PANEL
ALT: 50 U/L — ABNORMAL HIGH (ref 0–44)
AST: 119 U/L — ABNORMAL HIGH (ref 15–41)
Albumin: 3.7 g/dL (ref 3.5–5.0)
Alkaline Phosphatase: 56 U/L (ref 38–126)
Anion gap: 12 (ref 5–15)
BUN: 7 mg/dL (ref 6–20)
CO2: 25 mmol/L (ref 22–32)
Calcium: 8.8 mg/dL — ABNORMAL LOW (ref 8.9–10.3)
Chloride: 101 mmol/L (ref 98–111)
Creatinine, Ser: 0.98 mg/dL (ref 0.61–1.24)
GFR, Estimated: >60 mL/min (ref >60)
Glucose, Bld: 107 mg/dL — ABNORMAL HIGH (ref 70–99)
Potassium: 3.8 mmol/L (ref 3.5–5.1)
Sodium: 138 mmol/L (ref 135–145)
Total Bilirubin: 0.9 mg/dL (ref 0.3–1.2)
Total Protein: 7 g/dL (ref 6.5–8.1)

## 2020-09-08 LAB — RESP PANEL BY RT-PCR (FLU A&B, COVID) ARPGX2
Influenza A by PCR: NEGATIVE
Influenza B by PCR: NEGATIVE
SARS Coronavirus 2 by RT PCR: NEGATIVE

## 2020-09-08 LAB — TYPE AND SCREEN
ABO/RH(D): A POS
Antibody Screen: NEGATIVE

## 2020-09-08 LAB — CBC
HCT: 46.7 % (ref 39.0–52.0)
Hemoglobin: 16.3 g/dL (ref 13.0–17.0)
MCH: 36.1 pg — ABNORMAL HIGH (ref 26.0–34.0)
MCHC: 34.9 g/dL (ref 30.0–36.0)
MCV: 103.5 fL — ABNORMAL HIGH (ref 80.0–100.0)
Platelets: 138 10*3/uL — ABNORMAL LOW (ref 150–400)
RBC: 4.51 MIL/uL (ref 4.22–5.81)
RDW: 12.8 % (ref 11.5–15.5)
WBC: 12.2 10*3/uL — ABNORMAL HIGH (ref 4.0–10.5)
nRBC: 0 % (ref 0.0–0.2)

## 2020-09-08 LAB — I-STAT CHEM 8, ED
BUN: 7 mg/dL (ref 6–20)
Calcium, Ion: 1.05 mmol/L — ABNORMAL LOW (ref 1.15–1.40)
Chloride: 99 mmol/L (ref 98–111)
Creatinine, Ser: 1.3 mg/dL — ABNORMAL HIGH (ref 0.61–1.24)
Glucose, Bld: 100 mg/dL — ABNORMAL HIGH (ref 70–99)
HCT: 51 % (ref 39.0–52.0)
Hemoglobin: 17.3 g/dL — ABNORMAL HIGH (ref 13.0–17.0)
Potassium: 3.7 mmol/L (ref 3.5–5.1)
Sodium: 139 mmol/L (ref 135–145)
TCO2: 26 mmol/L (ref 22–32)

## 2020-09-08 LAB — PROTIME-INR
INR: 1.1 (ref 0.8–1.2)
Prothrombin Time: 14.2 seconds (ref 11.4–15.2)

## 2020-09-08 LAB — LACTIC ACID, PLASMA: Lactic Acid, Venous: 3.3 mmol/L (ref 0.5–1.9)

## 2020-09-08 LAB — ETHANOL: Alcohol, Ethyl (B): 349 mg/dL (ref <10)

## 2020-09-08 MED ORDER — TETANUS-DIPHTH-ACELL PERTUSSIS 5-2.5-18.5 LF-MCG/0.5 IM SUSY
0.5000 mL | PREFILLED_SYRINGE | Freq: Once | INTRAMUSCULAR | Status: AC
  Administered 2020-09-08: 0.5 mL via INTRAMUSCULAR
  Filled 2020-09-08: qty 0.5

## 2020-09-08 MED ORDER — SODIUM CHLORIDE 0.9% FLUSH
3.0000 mL | Freq: Two times a day (BID) | INTRAVENOUS | Status: DC
  Administered 2020-09-08: 3 mL via INTRAVENOUS

## 2020-09-08 MED ORDER — SODIUM CHLORIDE 0.9 % IV SOLN: 250.0000 mL | INTRAVENOUS | Status: DC | PRN

## 2020-09-08 MED ORDER — SODIUM CHLORIDE 0.9 % IV BOLUS
1000.0000 mL | Freq: Once | INTRAVENOUS | Status: AC
  Administered 2020-09-08: 1000 mL via INTRAVENOUS

## 2020-09-08 MED ORDER — FENTANYL CITRATE (PF) 100 MCG/2ML IJ SOLN
50.0000 ug | Freq: Once | INTRAMUSCULAR | Status: AC
  Administered 2020-09-08: 50 ug via INTRAVENOUS
  Filled 2020-09-08: qty 2

## 2020-09-08 MED ORDER — ONDANSETRON HCL 4 MG/2ML IJ SOLN
4.0000 mg | Freq: Once | INTRAMUSCULAR | Status: AC
  Administered 2020-09-08: 4 mg via INTRAVENOUS
  Filled 2020-09-08: qty 2

## 2020-09-08 MED ORDER — SODIUM CHLORIDE 0.9% FLUSH: 3.0000 mL | INTRAVENOUS | Status: DC | PRN

## 2020-09-08 MED ORDER — IOHEXOL 300 MG/ML  SOLN
100.0000 mL | Freq: Once | INTRAMUSCULAR | Status: AC | PRN
  Administered 2020-09-08: 100 mL via INTRAVENOUS
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
## 2020-09-08 NOTE — ED Notes (Signed)
Pa aware of critical lactic and etoh

## 2020-09-08 NOTE — ED Notes (Signed)
Patient transported to CT on ccm

## 2020-09-08 NOTE — ED Notes (Signed)
Pt back from CT

## 2020-09-08 NOTE — Progress Notes (Signed)
Called by EDP regarding left femur injury.  Incomplete imaging at this time.  Will review once all scans and work up are complete.  Have tentatively posted for surgery tomorrow for left femur IMN.  NPO tonight at [facility]
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
## 2020-09-09 ENCOUNTER — Other Ambulatory Visit: Payer: Self-pay

## 2020-09-09 ENCOUNTER — Inpatient Hospital Stay (HOSPITAL_COMMUNITY): Payer: No Typology Code available for payment source | Admitting: Certified Registered"

## 2020-09-09 ENCOUNTER — Encounter (HOSPITAL_COMMUNITY): Admission: EM | Disposition: A | Discharge status: Home / Self Care | Payer: Self-pay | Source: Home / Self Care

## 2020-09-09 ENCOUNTER — Inpatient Hospital Stay (HOSPITAL_COMMUNITY): Payer: No Typology Code available for payment source

## 2020-09-09 ENCOUNTER — Encounter (HOSPITAL_COMMUNITY): Payer: Self-pay

## 2020-09-09 DIAGNOSIS — D62 Acute posthemorrhagic anemia: Secondary | ICD-10-CM | POA: Diagnosis not present

## 2020-09-09 DIAGNOSIS — R413 Other amnesia: Secondary | ICD-10-CM | POA: Diagnosis present

## 2020-09-09 DIAGNOSIS — S2243XA Multiple fractures of ribs, bilateral, initial encounter for closed fracture: Secondary | ICD-10-CM | POA: Diagnosis present

## 2020-09-09 DIAGNOSIS — S7222XA Displaced subtrochanteric fracture of left femur, initial encounter for closed fracture: Secondary | ICD-10-CM | POA: Diagnosis present

## 2020-09-09 DIAGNOSIS — S7290XA Unspecified fracture of unspecified femur, initial encounter for closed fracture: Secondary | ICD-10-CM | POA: Diagnosis present

## 2020-09-09 DIAGNOSIS — D696 Thrombocytopenia, unspecified: Secondary | ICD-10-CM | POA: Diagnosis not present

## 2020-09-09 DIAGNOSIS — M549 Dorsalgia, unspecified: Secondary | ICD-10-CM | POA: Diagnosis present

## 2020-09-09 DIAGNOSIS — F17210 Nicotine dependence, cigarettes, uncomplicated: Secondary | ICD-10-CM | POA: Diagnosis present

## 2020-09-09 DIAGNOSIS — Z23 Encounter for immunization: Secondary | ICD-10-CM | POA: Diagnosis not present

## 2020-09-09 DIAGNOSIS — Z20822 Contact with and (suspected) exposure to covid-19: Secondary | ICD-10-CM | POA: Diagnosis present

## 2020-09-09 DIAGNOSIS — Y92410 Unspecified street and highway as the place of occurrence of the external cause: Secondary | ICD-10-CM | POA: Diagnosis not present

## 2020-09-09 HISTORY — PX: FEMUR IM NAIL: SHX1597

## 2020-09-09 LAB — RAPID URINE DRUG SCREEN, HOSP PERFORMED
Amphetamines: NOT DETECTED
Barbiturates: NOT DETECTED
Benzodiazepines: NOT DETECTED
Cocaine: NOT DETECTED
Opiates: NOT DETECTED
Tetrahydrocannabinol: POSITIVE — AB

## 2020-09-09 LAB — BASIC METABOLIC PANEL
Anion gap: 15 (ref 5–15)
BUN: 6 mg/dL (ref 6–20)
CO2: 20 mmol/L — ABNORMAL LOW (ref 22–32)
Calcium: 8.1 mg/dL — ABNORMAL LOW (ref 8.9–10.3)
Chloride: 102 mmol/L (ref 98–111)
Creatinine, Ser: 0.75 mg/dL (ref 0.61–1.24)
GFR, Estimated: >60 mL/min (ref >60)
Glucose, Bld: 103 mg/dL — ABNORMAL HIGH (ref 70–99)
Potassium: 4 mmol/L (ref 3.5–5.1)
Sodium: 137 mmol/L (ref 135–145)

## 2020-09-09 LAB — URINALYSIS, ROUTINE W REFLEX MICROSCOPIC
Bacteria, UA: NONE SEEN
Bilirubin Urine: NEGATIVE
Glucose, UA: NEGATIVE mg/dL
Ketones, ur: NEGATIVE mg/dL
Leukocytes,Ua: NEGATIVE
Nitrite: NEGATIVE
Protein, ur: NEGATIVE mg/dL
Specific Gravity, Urine: 1.034 — ABNORMAL HIGH (ref 1.005–1.030)
pH: 5 (ref 5.0–8.0)

## 2020-09-09 LAB — CBC
HCT: 42.5 % (ref 39.0–52.0)
Hemoglobin: 14.9 g/dL (ref 13.0–17.0)
MCH: 36 pg — ABNORMAL HIGH (ref 26.0–34.0)
MCHC: 35.1 g/dL (ref 30.0–36.0)
MCV: 102.7 fL — ABNORMAL HIGH (ref 80.0–100.0)
Platelets: 116 10*3/uL — ABNORMAL LOW (ref 150–400)
RBC: 4.14 MIL/uL — ABNORMAL LOW (ref 4.22–5.81)
RDW: 13 % (ref 11.5–15.5)
WBC: 11.9 10*3/uL — ABNORMAL HIGH (ref 4.0–10.5)
nRBC: 0 % (ref 0.0–0.2)

## 2020-09-09 LAB — ABO/RH: ABO/RH(D): A POS

## 2020-09-09 LAB — MRSA PCR SCREENING: MRSA by PCR: NEGATIVE

## 2020-09-09 LAB — HIV ANTIBODY (ROUTINE TESTING W REFLEX): HIV Screen 4th Generation wRfx: NONREACTIVE

## 2020-09-09 SURGERY — INSERTION, INTRAMEDULLARY ROD, FEMUR
Anesthesia: General | Laterality: Left

## 2020-09-09 MED ORDER — METOCLOPRAMIDE HCL 5 MG/ML IJ SOLN: 5.0000 mg | Freq: Three times a day (TID) | INTRAMUSCULAR | Status: DC | PRN

## 2020-09-09 MED ORDER — HYDROMORPHONE HCL 1 MG/ML IJ SOLN
0.5000 mg | INTRAMUSCULAR | Status: DC | PRN
  Administered 2020-09-09 – 2020-09-10 (×2): 0.5 mg via INTRAVENOUS
  Filled 2020-09-09 (×2): qty 1

## 2020-09-09 MED ORDER — SUFENTANIL CITRATE 50 MCG/ML IV SOLN
INTRAVENOUS | Status: DC | PRN
  Administered 2020-09-09: 10 ug via INTRAVENOUS

## 2020-09-09 MED ORDER — SUGAMMADEX SODIUM 200 MG/2ML IV SOLN
INTRAVENOUS | Status: DC | PRN
  Administered 2020-09-09: 200 mg via INTRAVENOUS

## 2020-09-09 MED ORDER — METHOCARBAMOL 500 MG PO TABS
500.0000 mg | ORAL_TABLET | Freq: Three times a day (TID) | ORAL | Status: DC
  Administered 2020-09-09: 500 mg via ORAL
  Filled 2020-09-09: qty 1

## 2020-09-09 MED ORDER — OXYCODONE HCL 5 MG/5ML PO SOLN: 5.0000 mg | Freq: Once | ORAL | Status: DC | PRN

## 2020-09-09 MED ORDER — OXYCODONE HCL 5 MG PO TABS
5.0000 mg | ORAL_TABLET | Freq: Four times a day (QID) | ORAL | Status: DC | PRN
  Administered 2020-09-09 – 2020-09-10 (×3): 10 mg via ORAL
  Filled 2020-09-09 (×3): qty 2

## 2020-09-09 MED ORDER — LACTATED RINGERS IV SOLN: INTRAVENOUS | Status: DC

## 2020-09-09 MED ORDER — ACETAMINOPHEN 500 MG PO TABS: 1000.0000 mg | ORAL_TABLET | Freq: Once | ORAL | Status: DC | PRN

## 2020-09-09 MED ORDER — ACETAMINOPHEN 10 MG/ML IV SOLN
INTRAVENOUS | Status: DC | PRN
  Administered 2020-09-09: 1000 mg via INTRAVENOUS

## 2020-09-09 MED ORDER — CHLORHEXIDINE GLUCONATE 0.12 % MT SOLN
OROMUCOSAL | Status: AC
  Administered 2020-09-09: 15 mL via OROMUCOSAL
  Filled 2020-09-09: qty 15

## 2020-09-09 MED ORDER — PHENYLEPHRINE HCL (PRESSORS) 10 MG/ML IV SOLN
INTRAVENOUS | Status: DC | PRN
  Administered 2020-09-09: 80 ug via INTRAVENOUS

## 2020-09-09 MED ORDER — CEFAZOLIN SODIUM-DEXTROSE 2-4 GM/100ML-% IV SOLN
2.0000 g | INTRAVENOUS | Status: AC
  Administered 2020-09-09: 2 g via INTRAVENOUS
  Filled 2020-09-09: qty 100

## 2020-09-09 MED ORDER — ONDANSETRON HCL 4 MG/2ML IJ SOLN: 4.0000 mg | Freq: Four times a day (QID) | INTRAMUSCULAR | Status: DC | PRN

## 2020-09-09 MED ORDER — 0.9 % SODIUM CHLORIDE (POUR BTL) OPTIME
TOPICAL | Status: DC | PRN
  Administered 2020-09-09: 1000 mL

## 2020-09-09 MED ORDER — TRANEXAMIC ACID-NACL 1000-0.7 MG/100ML-% IV SOLN
1000.0000 mg | INTRAVENOUS | Status: AC
  Administered 2020-09-09: 1000 mg via INTRAVENOUS
  Filled 2020-09-09: qty 100

## 2020-09-09 MED ORDER — DEXAMETHASONE SODIUM PHOSPHATE 10 MG/ML IJ SOLN
INTRAMUSCULAR | Status: DC | PRN
  Administered 2020-09-09: 10 mg via INTRAVENOUS

## 2020-09-09 MED ORDER — HYDROMORPHONE HCL 1 MG/ML IJ SOLN
0.5000 mg | INTRAMUSCULAR | Status: DC | PRN
  Administered 2020-09-09: 0.5 mg via INTRAVENOUS
  Filled 2020-09-09: qty 1

## 2020-09-09 MED ORDER — ACETAMINOPHEN 325 MG PO TABS: 650.0000 mg | ORAL_TABLET | Freq: Four times a day (QID) | ORAL | Status: DC | PRN

## 2020-09-09 MED ORDER — MIDAZOLAM HCL 2 MG/2ML IJ SOLN
INTRAMUSCULAR | Status: DC | PRN
  Administered 2020-09-09: 2 mg via INTRAVENOUS

## 2020-09-09 MED ORDER — FENTANYL CITRATE (PF) 100 MCG/2ML IJ SOLN
25.0000 ug | INTRAMUSCULAR | Status: DC | PRN
  Administered 2020-09-09 (×2): 50 ug via INTRAVENOUS

## 2020-09-09 MED ORDER — OXYCODONE HCL 5 MG PO TABS
5.0000 mg | ORAL_TABLET | ORAL | Status: DC | PRN
Start: 2020-09-09 — End: 2020-09-09

## 2020-09-09 MED ORDER — ONDANSETRON 4 MG PO TBDP: 4.0000 mg | ORAL_TABLET | Freq: Four times a day (QID) | ORAL | Status: DC | PRN

## 2020-09-09 MED ORDER — LIDOCAINE HCL (CARDIAC) PF 100 MG/5ML IV SOSY
PREFILLED_SYRINGE | INTRAVENOUS | Status: DC | PRN
  Administered 2020-09-09: 60 mg via INTRAVENOUS

## 2020-09-09 MED ORDER — ACETAMINOPHEN 160 MG/5ML PO SOLN: 1000.0000 mg | Freq: Once | ORAL | Status: DC | PRN

## 2020-09-09 MED ORDER — POVIDONE-IODINE 10 % EX SWAB
2.0000 "application " | Freq: Once | CUTANEOUS | Status: AC
  Administered 2020-09-09: 2 via TOPICAL

## 2020-09-09 MED ORDER — DOCUSATE SODIUM 100 MG PO CAPS: 100.0000 mg | ORAL_CAPSULE | Freq: Two times a day (BID) | ORAL | Status: DC

## 2020-09-09 MED ORDER — ACETAMINOPHEN 10 MG/ML IV SOLN: 1000.0000 mg | Freq: Once | INTRAVENOUS | Status: DC | PRN

## 2020-09-09 MED ORDER — ENOXAPARIN SODIUM 30 MG/0.3ML ~~LOC~~ SOLN: 30.0000 mg | Freq: Two times a day (BID) | SUBCUTANEOUS | Status: DC

## 2020-09-09 MED ORDER — SUFENTANIL CITRATE 50 MCG/ML IV SOLN
INTRAVENOUS | Status: AC
  Filled 2020-09-09: qty 1

## 2020-09-09 MED ORDER — KETOROLAC TROMETHAMINE 30 MG/ML IJ SOLN
INTRAMUSCULAR | Status: DC | PRN
  Administered 2020-09-09: 30 mg via INTRAVENOUS

## 2020-09-09 MED ORDER — METOCLOPRAMIDE HCL 10 MG PO TABS: 5.0000 mg | ORAL_TABLET | Freq: Three times a day (TID) | ORAL | Status: DC | PRN

## 2020-09-09 MED ORDER — CHLORHEXIDINE GLUCONATE 0.12 % MT SOLN: 15.0000 mL | Freq: Once | OROMUCOSAL | Status: AC

## 2020-09-09 MED ORDER — ENSURE PRE-SURGERY PO LIQD
296.0000 mL | Freq: Once | ORAL | Status: AC
  Administered 2020-09-09: 296 mL via ORAL
  Filled 2020-09-09: qty 296

## 2020-09-09 MED ORDER — LACTATED RINGERS IV SOLN: INTRAVENOUS | Status: DC | PRN

## 2020-09-09 MED ORDER — ORAL CARE MOUTH RINSE: 15.0000 mL | Freq: Once | OROMUCOSAL | Status: AC

## 2020-09-09 MED ORDER — PHENYLEPHRINE HCL-NACL 10-0.9 MG/250ML-% IV SOLN
INTRAVENOUS | Status: DC | PRN
  Administered 2020-09-09: 30 ug/min via INTRAVENOUS

## 2020-09-09 MED ORDER — PROPOFOL 10 MG/ML IV BOLUS
INTRAVENOUS | Status: DC | PRN
  Administered 2020-09-09: 110 mg via INTRAVENOUS

## 2020-09-09 MED ORDER — FENTANYL CITRATE (PF) 100 MCG/2ML IJ SOLN
INTRAMUSCULAR | Status: AC
  Filled 2020-09-09: qty 2

## 2020-09-09 MED ORDER — OXYCODONE HCL 5 MG PO TABS: 5.0000 mg | ORAL_TABLET | Freq: Once | ORAL | Status: DC | PRN

## 2020-09-09 MED ORDER — MIDAZOLAM HCL 2 MG/2ML IJ SOLN
INTRAMUSCULAR | Status: AC
  Filled 2020-09-09: qty 2

## 2020-09-09 MED ORDER — ROCURONIUM 10MG/ML (10ML) SYRINGE FOR MEDFUSION PUMP - OPTIME
INTRAVENOUS | Status: DC | PRN
  Administered 2020-09-09: 70 mg via INTRAVENOUS

## 2020-09-09 MED ORDER — PROPOFOL 10 MG/ML IV BOLUS
INTRAVENOUS | Status: AC
  Filled 2020-09-09: qty 20

## 2020-09-09 MED ORDER — ONDANSETRON HCL 4 MG PO TABS: 4.0000 mg | ORAL_TABLET | Freq: Four times a day (QID) | ORAL | Status: DC | PRN

## 2020-09-09 MED ORDER — DOCUSATE SODIUM 100 MG PO CAPS
100.0000 mg | ORAL_CAPSULE | Freq: Two times a day (BID) | ORAL | Status: DC
  Administered 2020-09-09 – 2020-09-13 (×8): 100 mg via ORAL
  Filled 2020-09-09 (×7): qty 1

## 2020-09-09 MED ORDER — METOPROLOL TARTRATE 5 MG/5ML IV SOLN: 5.0000 mg | Freq: Four times a day (QID) | INTRAVENOUS | Status: DC | PRN

## 2020-09-09 MED ORDER — CEFAZOLIN SODIUM-DEXTROSE 1-4 GM/50ML-% IV SOLN
1.0000 g | Freq: Four times a day (QID) | INTRAVENOUS | Status: AC
  Administered 2020-09-09 – 2020-09-10 (×3): 1 g via INTRAVENOUS
  Filled 2020-09-09 (×3): qty 50

## 2020-09-09 MED ORDER — ALBUMIN HUMAN 5 % IV SOLN: INTRAVENOUS | Status: DC | PRN

## 2020-09-09 MED ORDER — ONDANSETRON HCL 4 MG/2ML IJ SOLN
INTRAMUSCULAR | Status: DC | PRN
  Administered 2020-09-09: 4 mg via INTRAVENOUS

## 2020-09-09 MED ORDER — CHLORHEXIDINE GLUCONATE 4 % EX LIQD: 60.0000 mL | Freq: Once | CUTANEOUS | Status: DC

## 2020-09-09 SURGICAL SUPPLY — 37 items
ALCOHOL 70% 16 OZ (MISCELLANEOUS) ×2 IMPLANT
BIT DRILL CANN FLEX 14 (BIT) ×1 IMPLANT
BIT DRILL SHORT 4.2 (BIT) IMPLANT
BIT DRILL STEP 4.5X6.5 (BIT) IMPLANT
BNDG COHESIVE 4X5 TAN STRL (GAUZE/BANDAGES/DRESSINGS) ×2 IMPLANT
BNDG COHESIVE 6X5 TAN STRL LF (GAUZE/BANDAGES/DRESSINGS) ×2 IMPLANT
COVER PERINEAL POST (MISCELLANEOUS) ×2 IMPLANT
COVER SURGICAL LIGHT HANDLE (MISCELLANEOUS) ×2 IMPLANT
DRAPE C-ARM 42X72 X-RAY (DRAPES) ×1 IMPLANT
DRAPE C-ARMOR (DRAPES) ×1 IMPLANT
DRAPE INCISE IOBAN 66X45 STRL (DRAPES) ×1 IMPLANT
DRAPE STERI IOBAN 125X83 (DRAPES) ×4 IMPLANT
DRILL BIT SHORT 4.2 (BIT) ×4
DRILL BIT STEP 4.5X6.5 (BIT) ×2
DRSG ADAPTIC 3X8 NADH LF (GAUZE/BANDAGES/DRESSINGS) ×1 IMPLANT
DRSG TEGADERM 2-3/8X2-3/4 SM (GAUZE/BANDAGES/DRESSINGS) ×1 IMPLANT
DURAPREP 26ML APPLICATOR (WOUND CARE) ×2 IMPLANT
GLOVE BIO SURGEON STRL SZ7.5 (GLOVE) ×6 IMPLANT
GLOVE SRG 8 PF TXTR STRL LF DI (GLOVE) ×2 IMPLANT
GLOVE SURG UNDER POLY LF SZ8 (GLOVE) ×4
GOWN STRL REUS W/ TWL XL LVL3 (GOWN DISPOSABLE) ×1 IMPLANT
GOWN STRL REUS W/TWL XL LVL3 (GOWN DISPOSABLE) ×6
GUIDEWIRE 3.2X400 (WIRE) ×3 IMPLANT
KIT BASIN OR (CUSTOM PROCEDURE TRAY) ×2 IMPLANT
KIT TURNOVER KIT B (KITS) ×2 IMPLANT
NAIL CANN FRN TI 9X360 LT (Nail) ×1 IMPLANT
NS IRRIG 1000ML POUR BTL (IV SOLUTION) ×2 IMPLANT
PACK GENERAL/GYN (CUSTOM PROCEDURE TRAY) ×2 IMPLANT
PAD ARMBOARD 7.5X6 YLW CONV (MISCELLANEOUS) ×4 IMPLANT
REAMER ROD DEEP FLUTE 2.5X950 (INSTRUMENTS) ×1 IMPLANT
SCREW LOCK HDLS 5X44 (Screw) ×1 IMPLANT
SCREW LOCK HDLS 5X50 (Screw) ×1 IMPLANT
SCREW RECON 6.5 X 85MM (Screw) ×1 IMPLANT
SCREW RECON 6.5X100MM (Screw) ×1 IMPLANT
STAPLER VISISTAT 35W (STAPLE) ×2 IMPLANT
SUT VIC AB 2-0 CT1 27 (SUTURE) ×4
SUT VIC AB 2-0 CT1 TAPERPNT 27 (SUTURE) ×2 IMPLANT
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
## 2020-09-09 NOTE — Brief Op Note (Signed)
09/09/2020  10:02 AM  PATIENT:  Carmelina Noun  50 y.o. male  PRE-OPERATIVE DIAGNOSIS:  LEFT FEMUR FRACTURE  POST-OPERATIVE DIAGNOSIS:  LEFT FEMUR FRACTURE  PROCEDURE:  Procedure(s): INTRAMEDULLARY (IM) NAIL FEMORAL (Left)  SURGEON:  Surgeon(s) and Role:    * Yolonda Kida, MD - Primary  PHYSICIAN ASSISTANT: Dion Saucier, PA-C  ANESTHESIA:   general  EBL:  100 mL   BLOOD ADMINISTERED:none  DRAINS: none   LOCAL MEDICATIONS USED:  MARCAINE     SPECIMEN:  No Specimen  DISPOSITION OF SPECIMEN:  N/A  COUNTS:  YES  TOURNIQUET:  * No tourniquets in log *  DICTATION: .Note written in EPIC  PLAN OF CARE: Admit to inpatient   PATIENT DISPOSITION:  PACU - hemodynamically stable.   Delay start of Pharmacological VTE agent (>24hrs) due to surgical blood loss or risk of bleeding: not applicable
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
## 2020-09-09 NOTE — Transfer of Care (Signed)
Immediate Anesthesia Transfer of Care Note  Patient: Douglas Snyder  Procedure(s) Performed: INTRAMEDULLARY (IM) NAIL FEMORAL (Left )  Patient Location: PACU  Anesthesia Type:General  Level of Consciousness: unresponsive, drowsy, patient cooperative and responds to stimulation  Airway & Oxygen Therapy: Patient Spontanous Breathing and Patient connected to nasal cannula oxygen  Post-op Assessment: Report given to RN, Post -op Vital signs reviewed and stable and Patient moving all extremities X 4  Post vital signs: Reviewed and stable  Last Vitals:  Vitals Value Taken Time  BP 160/88 09/09/20 0956  Temp    Pulse 100 09/09/20 1000  Resp 15 09/09/20 1000  SpO2 96 % 09/09/20 1000  Vitals shown include unvalidated device data.  Last Pain:  Vitals:   09/09/20 0704  TempSrc:   PainSc: 10-Worst pain ever      Patients Stated Pain Goal: 2 (09/09/20 0704)  Complications: No complications documented.
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
## 2020-09-09 NOTE — Progress Notes (Signed)
Orthopedic Tech Progress Note Patient Details:  Douglas Snyder 1971-02-16 [id number]  Musculoskeletal Traction Type of Traction: Bucks Skin Traction Traction Location: lle Traction Weight: 5 lbs   Post Interventions Patient Tolerated: Well Instructions Provided: Care of device,Adjustment of device   Trinna Post 09/09/2020, 5:14 AM
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
## 2020-09-09 NOTE — ED Notes (Addendum)
pts sister at bedside, Adair Laundry [phone], ortho tech called for splint placement , pt moved to admissions  Hospital bed.
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
## 2020-09-09 NOTE — Anesthesia Procedure Notes (Signed)
Procedure Name: Intubation Date/Time: 09/09/2020 8:01 AM Performed by: Claris Che, CRNA Pre-anesthesia Checklist: Patient identified, Emergency Drugs available, Suction available, Patient being monitored and Timeout performed Patient Re-evaluated:Patient Re-evaluated prior to induction Oxygen Delivery Method: Circle system utilized Preoxygenation: Pre-oxygenation with 100% oxygen Induction Type: IV induction and Cricoid Pressure applied Ventilation: Mask ventilation without difficulty Laryngoscope Size: Mac and 4 Grade View: Grade II Tube type: Oral Tube size: 8.0 mm Number of attempts: 1 Airway Equipment and Method: Stylet Placement Confirmation: ETT inserted through vocal cords under direct vision,  positive ETCO2 and breath sounds checked- equal and bilateral Secured at: 23 cm Tube secured with: Tape Dental Injury: Teeth and Oropharynx as per pre-operative assessment

## 2020-09-10 MED ORDER — ACETAMINOPHEN 500 MG PO TABS
1000.0000 mg | ORAL_TABLET | Freq: Three times a day (TID) | ORAL | Status: DC
  Administered 2020-09-10 – 2020-09-13 (×9): 1000 mg via ORAL
  Filled 2020-09-10 (×9): qty 2

## 2020-09-10 MED ORDER — OXYCODONE HCL 5 MG PO TABS
5.0000 mg | ORAL_TABLET | ORAL | Status: DC | PRN
Start: 2020-09-10 — End: 2020-09-10

## 2020-09-10 MED ORDER — METHOCARBAMOL 500 MG PO TABS: 500.0000 mg | ORAL_TABLET | Freq: Three times a day (TID) | ORAL | Status: DC

## 2020-09-10 MED ORDER — OXYCODONE HCL 5 MG PO TABS
5.0000 mg | ORAL_TABLET | ORAL | Status: DC | PRN
  Administered 2020-09-10 – 2020-09-13 (×11): 10 mg via ORAL
  Filled 2020-09-10 (×12): qty 2

## 2020-09-10 MED ORDER — HYDROMORPHONE HCL 1 MG/ML IJ SOLN: 0.5000 mg | INTRAMUSCULAR | Status: DC | PRN

## 2020-09-10 MED ORDER — METHOCARBAMOL 750 MG PO TABS
750.0000 mg | ORAL_TABLET | Freq: Three times a day (TID) | ORAL | Status: DC
  Administered 2020-09-10 – 2020-09-13 (×11): 750 mg via ORAL
  Filled 2020-09-10 (×11): qty 1
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
## 2020-09-10 NOTE — Plan of Care (Signed)
Patient ordered food at 430pm they did not bring his food until 8pm. He is feeling better since he could eat

## 2020-09-10 NOTE — Progress Notes (Signed)
   Subjective:  Patient reports pain as mild.  Some pain when up and walking on left leg.  "getting by, though."    Objective:   VITALS:   Vitals:   09/10/20 0300 09/10/20 0434 09/10/20 0700 09/10/20 1100  BP:  (!) 158/96 (!) 146/96 122/83  Pulse:  73 67 80  Resp:  20 20 20   Temp: 98.4 F (36.9 C) 98.4 F (36.9 C) 98.6 F (37 C) 98 F (36.7 C)  TempSrc: Oral Oral Oral Oral  SpO2:  100% 95% 96%  Weight:      Height:        Neurologically intact Neurovascular intact Sensation intact distally Intact pulses distally Dorsiflexion/Plantar flexion intact Incision: dressing C/D/I and scant drainage Compartment soft   Lab Results  Component Value Date   WBC 11.9 (H) 09/09/2020   HGB 14.9 09/09/2020   HCT 42.5 09/09/2020   MCV 102.7 (H) 09/09/2020   PLT 116 (L) 09/09/2020   BMET    Component Value Date/Time   NA 137 09/09/2020 0441   K 4.0 09/09/2020 0441   CL 102 09/09/2020 0441   CO2 20 (L) 09/09/2020 0441   GLUCOSE 103 (H) 09/09/2020 0441   BUN 6 09/09/2020 0441   CREATININE 0.75 09/09/2020 0441   CALCIUM 8.1 (L) 09/09/2020 0441   GFRNONAA >60 09/09/2020 0441     Assessment/Plan: 1 Day Post-Op   Active Problems:   Femur fracture (HCC)   Up with therapy - WBAT to LLE - change dressings PRN to dry dressings. - ok to shower now   - mechanical and chemical dvt ppx per trauma service, will need 6 weeks of asa, 81 mg daily post op/dc   11/09/2020 09/10/2020, 3:51 PM   11/10/2020, MD [phone]
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
## 2020-09-11 ENCOUNTER — Other Ambulatory Visit (HOSPITAL_COMMUNITY): Payer: Self-pay

## 2020-09-11 ENCOUNTER — Inpatient Hospital Stay (HOSPITAL_COMMUNITY): Payer: No Typology Code available for payment source

## 2020-09-11 ENCOUNTER — Encounter (HOSPITAL_COMMUNITY): Payer: Self-pay | Admitting: Orthopedic Surgery

## 2020-09-11 LAB — MAGNESIUM: Magnesium: 1.7 mg/dL (ref 1.7–2.4)

## 2020-09-11 LAB — BASIC METABOLIC PANEL
Anion gap: 6 (ref 5–15)
BUN: 8 mg/dL (ref 6–20)
CO2: 29 mmol/L (ref 22–32)
Calcium: 8.3 mg/dL — ABNORMAL LOW (ref 8.9–10.3)
Chloride: 99 mmol/L (ref 98–111)
Creatinine, Ser: 0.92 mg/dL (ref 0.61–1.24)
GFR, Estimated: >60 mL/min (ref >60)
Glucose, Bld: 105 mg/dL — ABNORMAL HIGH (ref 70–99)
Potassium: 2.9 mmol/L — ABNORMAL LOW (ref 3.5–5.1)
Sodium: 134 mmol/L — ABNORMAL LOW (ref 135–145)

## 2020-09-11 LAB — CBC
HCT: 35.6 % — ABNORMAL LOW (ref 39.0–52.0)
Hemoglobin: 12.3 g/dL — ABNORMAL LOW (ref 13.0–17.0)
MCH: 35.3 pg — ABNORMAL HIGH (ref 26.0–34.0)
MCHC: 34.6 g/dL (ref 30.0–36.0)
MCV: 102.3 fL — ABNORMAL HIGH (ref 80.0–100.0)
Platelets: 97 10*3/uL — ABNORMAL LOW (ref 150–400)
RBC: 3.48 MIL/uL — ABNORMAL LOW (ref 4.22–5.81)
RDW: 12.5 % (ref 11.5–15.5)
WBC: 10 10*3/uL (ref 4.0–10.5)
nRBC: 0 % (ref 0.0–0.2)

## 2020-09-11 MED ORDER — GUAIFENESIN 100 MG/5ML PO SOLN
5.0000 mL | Freq: Four times a day (QID) | ORAL | Status: AC
Start: 2020-09-11 — End: 2020-09-12
  Administered 2020-09-11 – 2020-09-12 (×4): 100 mg via ORAL
  Filled 2020-09-11 (×4): qty 10

## 2020-09-11 MED ORDER — POTASSIUM CHLORIDE CRYS ER 20 MEQ PO TBCR
40.0000 meq | EXTENDED_RELEASE_TABLET | Freq: Two times a day (BID) | ORAL | Status: AC
  Administered 2020-09-11 – 2020-09-12 (×4): 40 meq via ORAL
  Filled 2020-09-11 (×4): qty 2

## 2020-09-11 MED ORDER — GUAIFENESIN 100 MG/5ML PO SOLN: 5.0000 mL | Freq: Four times a day (QID) | ORAL | 0 refills | Status: DC | PRN

## 2020-09-11 MED ORDER — POLYETHYLENE GLYCOL 3350 17 G PO PACK: 17.0000 g | PACK | Freq: Every day | ORAL | 0 refills | Status: DC | PRN

## 2020-09-11 MED ORDER — ACETAMINOPHEN 500 MG PO TABS: 1000.0000 mg | ORAL_TABLET | Freq: Three times a day (TID) | ORAL | 0 refills | Status: DC | PRN

## 2020-09-11 MED ORDER — OXYCODONE HCL 5 MG PO TABS
5.0000 mg | ORAL_TABLET | Freq: Four times a day (QID) | ORAL | 0 refills | Status: DC | PRN
  Filled 2020-09-11: qty 30, 4d supply, fill #0

## 2020-09-11 MED ORDER — METHOCARBAMOL 750 MG PO TABS
750.0000 mg | ORAL_TABLET | Freq: Three times a day (TID) | ORAL | 0 refills | Status: DC | PRN
  Filled 2020-09-11: qty 30, 10d supply, fill #0

## 2020-09-11 NOTE — Discharge Instructions (Addendum)
Orthopedic discharge instructions:  -Okay to put full weightbearing on the left lower extremity as tolerated.  You should use an assistive device for the first month.  -You may begin showering on postoperative day #3.  Do not submerge underwater.  You can allow warm soapy water to flow over your incisions.  -If your dressings become saturated or soiled you should remove and replace with daily dry dressings.  -Apply ice to the left thigh at your incision sites for 20 to 30 minutes out of each hour that you are able.  Do this as often as possible throughout the day.  -For the prevention of blood clots take an 81 mg aspirin once per day x6 weeks.   -Follow-up with Dr. Aundria Rud at [facility] in 2 weeks for wound check and staple removal.   RIB FRACTURES (Left 5-12 rib fractures,Right 4-10 rib fractures) HOME INSTRUCTIONS   1. PAIN CONTROL:  1. Pain is best controlled by a usual combination of three different methods TOGETHER:  i. Ice/Heat ii. Over the counter pain medication iii. Prescription pain medication 2. You may experience some swelling and bruising in area of broken ribs. Ice packs or heating pads (30-60 minutes up to 6 times a day) will help. Use ice for the first few days to help decrease swelling and bruising, then switch to heat to help relax tight/sore spots and speed recovery. Some people prefer to use ice alone, heat alone, alternating between ice & heat. Experiment to what works for you. Swelling and bruising can take several weeks to resolve.  3. It is helpful to take an over-the-counter pain medication regularly for the first few weeks. Choose one of the following that works best for you:  i. Naproxen (Aleve, etc) Two 220mg  tabs twice a day ii. Ibuprofen (Advil, etc) Three 200mg  tabs four times a day (every meal & bedtime) iii. Acetaminophen (Tylenol, etc) 500-650mg  four times a day (every meal & bedtime) 4. A prescription for pain medication (such as oxycodone,  hydrocodone, etc) may be given to you upon discharge. Take your pain medication as prescribed.  i. If you are having problems/concerns with the prescription medicine (does not control pain, nausea, vomiting, rash, itching, etc), please call [phone] to see if we need to switch you to a different pain medicine that will work better for you and/or control your side effect better. ii. If you need a refill on your pain medication, please contact your pharmacy. They will contact our office to request authorization. Prescriptions will not be filled after 5 pm or on week-ends. 1. Avoid getting constipated. When taking pain medications, it is common to experience some constipation. Increasing fluid intake and taking a fiber supplement (such as Metamucil, Citrucel, FiberCon, MiraLax, etc) 1-2 times a day regularly will usually help prevent this problem from occurring. A mild laxative (prune juice, Milk of Magnesia, MiraLax, etc) should be taken according to package directions if there are no bowel movements after 48 hours.  2. Watch out for diarrhea. If you have many loose bowel movements, simplify your diet to bland foods & liquids for a few days. Stop any stool softeners and decrease your fiber supplement. Switching to mild anti-diarrheal medications (Kayopectate, Pepto Bismol) can help. If this worsens or does not improve, please call us. 3. FOLLOW UP  a. If a follow up appointment is needed one will be scheduled for you. If none is needed with our trauma team, please follow up with your primary care provider within 2-3  weeks from discharge. Please call [facility] at [phone] if you have any questions about follow up.  b. If you have any orthopedic or other injuries you will need to follow up as outlined in your follow up instructions.   WHEN TO CALL us [phone]:  1. Poor pain control 2. Reactions / problems with new medications (rash/itching, nausea, etc)  3. Fever over 101.5 F (38.5  C) 4. Worsening swelling or bruising 5. Worsening pain, productive cough, difficulty breathing or any other concerning symptoms  The clinic staff is available to answer your questions during regular business hours (8:30am-5pm). Please don't hesitate to call and ask to speak to one of our nurses for clinical concerns.  If you have a medical emergency, go to the nearest emergency room or call 911.  A surgeon from [facility] is always on call at the [facility], [place]  [address], [place], [place] [address] ?  MAIN: [phone] ? TOLL FREE: [phone] ?  FAX [phone]  [email or web address]      Information on Rib Fractures  A rib fracture is a break or crack in one of the bones of the ribs. The ribs are long, curved bones that wrap around your chest and attach to your spine and your breastbone. The ribs protect your heart, lungs, and other organs in the chest. A broken or cracked rib is often painful but is not usually serious. Most rib fractures heal on their own over time. However, rib fractures can be more serious if multiple ribs are broken or if broken ribs move out of place and push against other structures or organs. What are the causes? This condition is caused by:  Repetitive movements with high force, such as pitching a baseball or having severe coughing spells.  A direct blow to the chest, such as a sports injury, a car accident, or a fall.  Cancer that has spread to the bones, which can weaken bones and cause them to break. What are the signs or symptoms? Symptoms of this condition include:  Pain when you breathe in or cough.  Pain when someone presses on the injured area.  Feeling short of breath. How is this diagnosed? This condition is diagnosed with a physical exam and medical history. Imaging tests may also be done, such as:  Chest X-ray.  CT scan.  MRI.  Bone scan.  Chest  ultrasound. How is this treated? Treatment for this condition depends on the severity of the fracture. Most rib fractures usually heal on their own in 1-3 months. Sometimes healing takes longer if there is a cough that does not stop or if there are other activities that make the injury worse (aggravating factors). While you heal, you will be given medicines to control the pain. You will also be taught deep breathing exercises. Severe injuries may require hospitalization or surgery. Follow these instructions at home: Managing pain, stiffness, and swelling  If directed, apply ice to the injured area. ? Put ice in a plastic bag. ? Place a towel between your skin and the bag. ? Leave the ice on for 20 minutes, 2-3 times a day.  Take over-the-counter and prescription medicines only as told by your health care provider. Activity  Avoid a lot of activity and any activities or movements that cause pain. Be careful during activities and avoid bumping the injured rib.  Slowly increase your activity as told by your health care provider. General instructions  Do deep breathing exercises as told by your health care provider. This helps prevent pneumonia, which is a common complication of a broken rib. Your health care provider may instruct you to: ? Take deep breaths several times a day. ? Try to cough several times a day, holding a pillow against the injured area. ? Use a device called incentive spirometer to practice deep breathing several times a day.  Drink enough fluid to keep your urine pale yellow.  Do not wear a rib belt or binder. These restrict breathing, which can lead to pneumonia.  Keep all follow-up visits as told by your health care provider. This is important. Contact a health care provider if:  You have a fever. Get help right away if:  You have difficulty breathing or you are short of breath.  You develop a cough that does not stop, or you cough up thick or bloody  sputum.  You have nausea, vomiting, or pain in your abdomen.  Your pain gets worse and medicine does not help. Summary  A rib fracture is a break or crack in one of the bones of the ribs.  A broken or cracked rib is often painful but is not usually serious.  Most rib fractures heal on their own over time.  Treatment for this condition depends on the severity of the fracture.  Avoid a lot of activity and any activities or movements that cause pain. This information is not intended to replace advice given to you by your health care provider. Make sure you discuss any questions you have with your health care provider. Document Released: 05/19/2005 Document Revised: 08/18/2016 Document Reviewed: 08/18/2016 Elsevier Interactive Patient Education  2019 [employer].

## 2020-09-11 NOTE — TOC Progression Note (Addendum)
Transition of Care [facility]) - Progression Note    Patient Details  Name: JAHAAN VANWAGNER MRN: [id number] Date of Birth: Jul 23, 1970  Transition of Care [facility]) CM/SW Contact  Glennon Mac, RN Phone Number: 09/11/2020, 4:19 PM  Clinical Narrative:   PT/OT evaluations complete.  PT recommending OP follow up; DME recommended for home. Referral to [employer] for recommended DME; 3 in 1, RW, and tub bench to be delivered to bedside prior to dc. Bedside nurse will need to order crutches from Orth[employer]. Pt for likely dc home tomorrow with his friend Aurea Graff for 24h assistance.  Referral to [facility] OP Rehab, [facility] location, for follow up.      Expected Discharge Plan: OP Rehab Barriers to Discharge: Continued Medical Work up  Expected Discharge Plan and Services Expected Discharge Plan: OP Rehab   Discharge Planning Services: CM Consult   Living arrangements for the past 2 months: Single Family Home                 DME Arranged: 3-N-1,Walker rolling,Tub bench DME Agency: [employer] Date DME Agency Contacted: 09/11/20 Time DME Agency Contacted: [phone] Representative spoke with at DME Agency: Velna Hatchet             Social Determinants of Health (SDOH) Interventions    Readmission Risk Interventions No flowsheet data found.  Quintella Baton, RN, BSN  Trauma/Neuro ICU Case Manager [phone]
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
## 2020-09-12 ENCOUNTER — Other Ambulatory Visit (HOSPITAL_COMMUNITY): Payer: Self-pay

## 2020-09-12 LAB — CBC
HCT: 34.6 % — ABNORMAL LOW (ref 39.0–52.0)
Hemoglobin: 12.1 g/dL — ABNORMAL LOW (ref 13.0–17.0)
MCH: 36.1 pg — ABNORMAL HIGH (ref 26.0–34.0)
MCHC: 35 g/dL (ref 30.0–36.0)
MCV: 103.3 fL — ABNORMAL HIGH (ref 80.0–100.0)
Platelets: 96 10*3/uL — ABNORMAL LOW (ref 150–400)
RBC: 3.35 MIL/uL — ABNORMAL LOW (ref 4.22–5.81)
RDW: 12.3 % (ref 11.5–15.5)
WBC: 9.5 10*3/uL (ref 4.0–10.5)
nRBC: 0 % (ref 0.0–0.2)

## 2020-09-12 LAB — BASIC METABOLIC PANEL
Anion gap: 7 (ref 5–15)
BUN: 6 mg/dL (ref 6–20)
CO2: 28 mmol/L (ref 22–32)
Calcium: 8.2 mg/dL — ABNORMAL LOW (ref 8.9–10.3)
Chloride: 99 mmol/L (ref 98–111)
Creatinine, Ser: 0.75 mg/dL (ref 0.61–1.24)
GFR, Estimated: >60 mL/min (ref >60)
Glucose, Bld: 113 mg/dL — ABNORMAL HIGH (ref 70–99)
Potassium: 3.6 mmol/L (ref 3.5–5.1)
Sodium: 134 mmol/L — ABNORMAL LOW (ref 135–145)

## 2020-09-12 LAB — MAGNESIUM: Magnesium: 1.8 mg/dL (ref 1.7–2.4)

## 2020-09-12 MED ORDER — POLYETHYLENE GLYCOL 3350 17 G PO PACK
17.0000 g | PACK | Freq: Every day | ORAL | Status: DC
  Administered 2020-09-13: 17 g via ORAL
  Filled 2020-09-12 (×2): qty 1

## 2020-09-12 MED ORDER — ASPIRIN 81 MG PO TBEC
81.0000 mg | DELAYED_RELEASE_TABLET | Freq: Every day | ORAL | 0 refills | Status: AC
  Filled 2020-09-12: qty 42, 42d supply, fill #0
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
## 2020-09-12 NOTE — TOC Transition Note (Signed)
Transition of Care [facility]) - CM/SW Discharge Note   Patient Details  Name: Douglas Snyder MRN: [id number] Date of Birth: 1971/01/27  Transition of Care [facility]) CM/SW Contact:  Glennon Mac, RN Phone Number: 09/12/2020, 3:21 PM   Clinical Narrative:   Pt medically stable for discharge home today with friend. DME has been delivered to room, and OP referral for physical therapy has been made.  Pt is uninsured, but is eligible for medication assistance through [facility] program.  DC rx sent to [facility] pharmacy to be filled using MATCH letter.     Final next level of care: OP Rehab Barriers to Discharge: Barriers Resolved   Patient Goals and CMS Choice Patient states their goals for this hospitalization and ongoing recovery are:: to go home CMS Medicare.gov Compare Post Acute Care list provided to:: Patient                          Discharge Plan and Services   Discharge Planning Services: CM Consult            DME Arranged: 3-N-1,Walker rolling,Tub bench DME Agency: [employer] Date DME Agency Contacted: 09/11/20 Time DME Agency Contacted: [phone] Representative spoke with at DME Agency: Velna Hatchet            Social Determinants of Health (SDOH) Interventions     Readmission Risk Interventions No flowsheet data found.  Quintella Baton, RN, BSN  Trauma/Neuro ICU Case Manager [phone]
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
## 2020-09-13 NOTE — Progress Notes (Signed)
Pt discharged home. Pt originally to go home yesterday, but stayed overnight due to being concerned about back pain. Pt's friend had already taken TOC medications and home equipment yesterday. No PIV present on assessment. All belongings packed. Pt educated on discharged information with no questions asked. Pt escorted to private vehicle in wheelchair with belongings.   Robina Ade, RN

## 2020-09-14 NOTE — Anesthesia Postprocedure Evaluation (Signed)
Anesthesia Post Note  Patient: Douglas Snyder  Procedure(s) Performed: INTRAMEDULLARY (IM) NAIL FEMORAL (Left )     Patient location during evaluation: PACU Anesthesia Type: General Level of consciousness: awake and alert Pain management: pain level controlled Vital Signs Assessment: post-procedure vital signs reviewed and stable Respiratory status: spontaneous breathing, nonlabored ventilation, respiratory function stable and patient connected to nasal cannula oxygen Cardiovascular status: blood pressure returned to baseline and stable Postop Assessment: no apparent nausea or vomiting Anesthetic complications: no   No complications documented.  Last Vitals:  Vitals:   09/13/20 1100 09/13/20 1619  BP: (!) 154/84 (!) 146/95  Pulse: 74 80  Resp: 18 15  Temp: 37 C 37.3 C  SpO2: 97% 100%    Last Pain:  Vitals:   09/13/20 1619  TempSrc: Oral  PainSc:                  Nyzier Boivin

## 2020-09-25 ENCOUNTER — Other Ambulatory Visit (HOSPITAL_COMMUNITY): Payer: Self-pay

## 2020-09-26 ENCOUNTER — Ambulatory Visit: Payer: Self-pay | Attending: Physician Assistant

## 2020-09-26 ENCOUNTER — Other Ambulatory Visit: Payer: Self-pay

## 2020-09-26 DIAGNOSIS — M25552 Pain in left hip: Secondary | ICD-10-CM | POA: Insufficient documentation

## 2020-09-26 DIAGNOSIS — M79605 Pain in left leg: Secondary | ICD-10-CM | POA: Insufficient documentation

## 2020-09-26 DIAGNOSIS — R2681 Unsteadiness on feet: Secondary | ICD-10-CM | POA: Insufficient documentation

## 2020-09-26 DIAGNOSIS — M6281 Muscle weakness (generalized): Secondary | ICD-10-CM | POA: Insufficient documentation

## 2020-09-26 DIAGNOSIS — R2689 Other abnormalities of gait and mobility: Secondary | ICD-10-CM | POA: Insufficient documentation
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
## 2020-09-27 NOTE — Addendum Note (Signed)
Addended by: Eloy End on: 09/27/2020 12:34 PM   Modules accepted: Orders

## 2020-09-28 ENCOUNTER — Telehealth: Payer: Self-pay

## 2020-09-28 NOTE — Telephone Encounter (Signed)
   Douglas Snyder DOB: May 03, 1971 MRN: [id number]   RIDER WAIVER AND RELEASE OF LIABILITY  For purposes of improving physical access to our facilities, [facility] is pleased to partner with third parties to provide [facility] patients or other authorized individuals the option of convenient, on-demand ground transportation services (the [employer]") through use of the technology service that enables users to request on-demand ground transportation from independent third-party providers.  By opting to use and accept these [employer], I, the undersigned, hereby agree on behalf of myself, and on behalf of any minor child using the [employer] for whom I am the parent or legal guardian, as follows:  1. Science writer provided to me are provided by independent third-party transportation providers who are not [employer] or employees and who are unaffiliated with [employer]. 2. [facility] [place] is neither a transportation carrier nor a common or public carrier. 3. [facility] has no control over the quality or safety of the transportation that occurs as a result of the [employer]. 4. [facility] cannot guarantee that any third-party transportation provider will complete any arranged transportation service. 5. [facility] makes no representation, warranty, or guarantee regarding the reliability, timeliness, quality, safety, suitability, or availability of any of the Transport Services or that they will be error free. 6. I fully understand that traveling by vehicle involves risks and dangers of serious bodily injury, including permanent disability, paralysis, and death. I agree, on behalf of myself and on behalf of any minor child using the Transport Services for whom I am the parent or legal guardian, that the entire risk arising out of my use of the [employer] remains solely with me, to the maximum extent permitted under applicable law. 7. The [employer] are provided "as is" and "as available." [facility] disclaims all representations and warranties, express, implied or statutory, not expressly set out in these terms, including the implied warranties of merchantability and fitness for a particular purpose. 8. I hereby waive and release [facility], its agents, employees, officers, directors, representatives, insurers, attorneys, assigns, successors, subsidiaries, and affiliates from any and all past, present, or future claims, demands, liabilities, actions, causes of action, or suits of any kind directly or indirectly arising from acceptance and use of the [employer]. 9. I further waive and release [facility] and its affiliates from all present and future liability and responsibility for any injury or death to persons or damages to property caused by or related to the use of the [employer]. 10. I have read this Waiver and Release of Liability, and I understand the terms used in it and their legal significance. This Waiver is freely and voluntarily given with the understanding that my right (as well as the right of any minor child for whom I am the parent or legal guardian using the [employer]) to legal recourse against [facility] in connection with the [employer] is knowingly surrendered in return for use of these services.   I attest that I read the consent document to Douglas Snyder, gave Douglas Snyder the opportunity to ask questions and answered the questions asked (if any). I affirm that Douglas Snyder then provided consent for he's participation in this program.     Douglas Snyder

## 2020-10-01 ENCOUNTER — Ambulatory Visit: Payer: Self-pay | Attending: Physician Assistant

## 2020-10-01 ENCOUNTER — Other Ambulatory Visit: Payer: Self-pay

## 2020-10-01 DIAGNOSIS — R2689 Other abnormalities of gait and mobility: Secondary | ICD-10-CM | POA: Insufficient documentation

## 2020-10-01 DIAGNOSIS — M6281 Muscle weakness (generalized): Secondary | ICD-10-CM | POA: Insufficient documentation

## 2020-10-01 DIAGNOSIS — M79605 Pain in left leg: Secondary | ICD-10-CM | POA: Insufficient documentation

## 2020-10-01 DIAGNOSIS — R2681 Unsteadiness on feet: Secondary | ICD-10-CM | POA: Insufficient documentation

## 2020-10-01 NOTE — Therapy (Signed)
[facility] Outpatient Rehabilitation [facility] [address] [place], [place], [address] Phone: [phone]   Fax:  [phone]  Physical Therapy Treatment  Patient Details  Name: Douglas Snyder MRN: [id number] Date of Birth: 08-Oct-1970 Referring Provider (PT): Juliet Rude, [place]   Encounter Date: 10/01/2020   PT End of Session - 10/01/20 1211    Visit Number 2    Number of Visits 7    Date for PT Re-Evaluation 11/08/20    Authorization Type Self Pay - FOTO 6th and 10th visit    PT Start Time 1215    PT Stop Time 1255    PT Time Calculation (min) 40 min    Activity Tolerance Patient tolerated treatment well;No increased pain    Behavior During Therapy WFL for tasks assessed/performed           Past Medical History:  Diagnosis Date  . Medical history non-contributory     Past Surgical History:  Procedure Laterality Date  . ANKLE ARTHODESIS W/ ARTHROSCOPY    . ANKLE SURGERY Bilateral   . FEMUR IM NAIL Left 09/09/2020   Procedure: INTRAMEDULLARY (IM) NAIL FEMORAL;  Surgeon: Yolonda Kida, MD;  Location: [facility] OR;  Service: Orthopedics;  Laterality: Left;  . ORIF ANKLE FRACTURE  2012   right  . ORIF ANKLE FRACTURE Left 07/27/2014   Procedure: OPEN REDUCTION INTERNAL FIXATION (ORIF) LEFT ANKLE FRACTURE;  Surgeon: Sheral Apley, MD;  Location: [facility];  Service: Orthopedics;  Laterality: Left;    There were no vitals filed for this visit.   Subjective Assessment - 10/01/20 1215    Subjective Pt presents to PT with continued reports of L LE and bilateral rib pain. He has been compliant with HEP per report with no adverse effect. Pt is ready to begin PT treatment at this time.    Currently in Pain? Yes    Pain Score 7     Pain Location Leg    Pain Orientation Left                             [facility] Adult PT Treatment/Exercise - 10/01/20 0001      Ambulation/Gait   Gait Comments ambulated in // bars w/  SPC in R hand swing through pattern x 10 laps; pt then ambulated 148ft with SPC in R hand swing through pattern Mod I      Knee/Hip Exercises: Aerobic   Nustep lvl 6 LE only x 5 min while taking subjective      Knee/Hip Exercises: Standing   Hip Abduction 2 sets;10 reps;Both    Forward Step Up 2 sets;10 reps;Both;Hand Hold: 2;Step Height: 8"    Functional Squat 2 sets;10 reps    Functional Squat Limitations UE support    Other Standing Knee Exercises weight shift x 10 ea      Knee/Hip Exercises: Seated   Long Arc Quad 3 sets;10 reps;Left    Long Arc Quad Weight 3 lbs.    Sit to Sand 3 sets;10 reps;without UE support   holding 6lb DB     Knee/Hip Exercises: Supine   Bridges 3 sets;10 reps    Straight Leg Raises 3 sets;10 reps;Left                  PT Education - 10/01/20 1312    Education Details HEP    Person(s) Educated Patient    Methods Explanation;Demonstration;Handout  Comprehension Verbalized understanding;Returned demonstration            PT Short Term Goals - 09/27/20 0854      PT SHORT TERM GOAL #1   Title Pt will be compliant and knowledgeable with 90% of HEP    Baseline initial HEP given    Time 3    Period Weeks    Status New    Target Date 10/18/20      PT SHORT TERM GOAL #2   Title Pt will decrease TUG time to no less than 15 seconds with LRAD in order to improve mobility    Baseline 19 seconds w/ FWW    Time 3    Period Weeks    Status New    Target Date 10/18/20             PT Long Term Goals - 09/27/20 0855      PT LONG TERM GOAL #1   Title Pt will improve FOTO functional score to no less than 60% in order to improve confidence and functional ability    Baseline 23% function    Time 6    Period Weeks    Status New    Target Date 11/08/20      PT LONG TERM GOAL #2   Title Pt will increase reps in 30 Second STS to no less than 12 in order to show improved functional mobility    Baseline 8    Time 6    Period Weeks     Status New    Target Date 11/08/20      PT LONG TERM GOAL #3   Title Pt will be able to ambulate 842ft with LRAD in order to improve safety with community navigation    Baseline 462ft with FWW    Time 6    Period Weeks    Status New    Target Date 11/08/20                 Plan - 10/01/20 1311    Clinical Impression Statement Pt was able to complete all prescribed exercises with no adverse effect or increase in pain. He showed improved functional mobility and gait with SPC in R hand, progressing to Mod I with practice in parallel bars. HEP updated to include standing proximal hip strengthening. Will continue to treat and progress per POC as prescribed.    PT Frequency 1x / week    PT Duration 6 weeks    PT Treatment/Interventions ADLs/Self Care Home Management;Electrical Stimulation;Moist Heat;Gait training;Stair training;Functional mobility training;Therapeutic activities;Therapeutic exercise;Neuromuscular re-education;Patient/family education;Manual techniques    PT Next Visit Plan assess SPC use at home; progress exercises as able    PT Home Exercise Plan Access Code: [email or web address]           Patient will benefit from skilled therapeutic intervention in order to improve the following deficits and impairments:  Abnormal gait,Decreased balance,Decreased activity tolerance,Decreased endurance,Decreased range of motion,Decreased strength,Difficulty walking,Pain  Visit Diagnosis: Muscle weakness (generalized)  Other abnormalities of gait and mobility  Unsteadiness on feet  Pain in left leg     Problem List Patient Active Problem List   Diagnosis Date Noted  . Femur fracture (HCC) 09/09/2020  . Trimalleolar fracture of ankle, closed 07/27/2014    Eloy End, PT, DPT 10/01/20 1:13 PM  [facility] Outpatient Rehabilitation [facility] [address] [place], [place], [address] Phone: [phone]   Fax:  [phone]  Name: Douglas Snyder MRN:  [id number]  Date of Birth: [birth year]

## 2020-10-08 DIAGNOSIS — Z4789 Encounter for other orthopedic aftercare: Secondary | ICD-10-CM | POA: Insufficient documentation

## 2020-10-10 ENCOUNTER — Other Ambulatory Visit: Payer: Self-pay

## 2020-10-10 ENCOUNTER — Ambulatory Visit: Payer: Self-pay

## 2020-10-10 DIAGNOSIS — R2689 Other abnormalities of gait and mobility: Secondary | ICD-10-CM

## 2020-10-10 DIAGNOSIS — M79605 Pain in left leg: Secondary | ICD-10-CM

## 2020-10-10 DIAGNOSIS — M6281 Muscle weakness (generalized): Secondary | ICD-10-CM

## 2020-10-10 DIAGNOSIS — R2681 Unsteadiness on feet: Secondary | ICD-10-CM

## 2020-10-10 NOTE — Therapy (Signed)
[facility] [address] [place], [place], [address] Phone: [phone]   Fax:  [phone]  Physical Therapy Treatment  Patient Details  Name: Douglas Snyder MRN: [id number] Date of Birth: 08-May-1971 Referring Provider (PT): Juliet Rude, [place]   Encounter Date: 10/10/2020   PT End of Session - 10/10/20 1211    Visit Number 3    Number of Visits 7    Date for PT Re-Evaluation 11/08/20    Authorization Type Self Pay - FOTO 6th and 10th visit    PT Start Time 1213    PT Stop Time 1255    PT Time Calculation (min) 42 min    Activity Tolerance Patient tolerated treatment well;No increased pain    Behavior During Therapy WFL for tasks assessed/performed           Past Medical History:  Diagnosis Date  . Medical history non-contributory     Past Surgical History:  Procedure Laterality Date  . ANKLE ARTHODESIS W/ ARTHROSCOPY    . ANKLE SURGERY Bilateral   . FEMUR IM NAIL Left 09/09/2020   Procedure: INTRAMEDULLARY (IM) NAIL FEMORAL;  Surgeon: Yolonda Kida, MD;  Location: [facility] OR;  Service: Orthopedics;  Laterality: Left;  . ORIF ANKLE FRACTURE  2012   right  . ORIF ANKLE FRACTURE Left 07/27/2014   Procedure: OPEN REDUCTION INTERNAL FIXATION (ORIF) LEFT ANKLE FRACTURE;  Surgeon: Sheral Apley, MD;  Location: [facility];  Service: Orthopedics;  Laterality: Left;    There were no vitals filed for this visit.   Subjective Assessment - 10/10/20 1211    Subjective Pt presents to PT with no current reports of L LE pain. He has been compliant with his HEP with no adverse effect. Pt is ready to begin PT treatment at this time.    Currently in Pain? No/denies    Pain Score 0-No pain              [facility] PT Assessment - 10/10/20 0001      Timed Up and Go Test   TUG Normal TUG    Normal TUG (seconds) 10    TUG Comments w/ SPC                         [facility] Adult PT  Treatment/Exercise - 10/10/20 0001      Knee/Hip Exercises: Aerobic   Nustep lvl 6 UE/LE only x 5 min while taking subjective      Knee/Hip Exercises: Standing   Hip Flexion 20 reps;Both    Hip Abduction 2 sets;10 reps;Both    Abduction Limitations 2lbs    Hip Extension 2 sets;10 reps;Both    Extension Limitations 2lbs    Forward Step Up 2 sets;10 reps;Both;Hand Hold: 2;Step Height: 8"    Functional Squat 2 sets;10 reps    Functional Squat Limitations UE support    Other Standing Knee Exercises lateral walk red tband x 2 laps in // bars      Knee/Hip Exercises: Seated   Long Arc Quad 3 sets;10 reps;Other (comment)   holding 8lb DB   Long Arc Quad Weight 4 lbs.      Knee/Hip Exercises: Supine   Bridges 2 sets;15 reps    Straight Leg Raises 2 sets;15 reps;Both      Knee/Hip Exercises: Sidelying   Clams 2x10 red tband LLE  PT Education - 10/10/20 1326    Education Details HEP            PT Short Term Goals - 09/27/20 0854      PT SHORT TERM GOAL #1   Title Pt will be compliant and knowledgeable with 90% of HEP    Baseline initial HEP given    Time 3    Period Weeks    Status New    Target Date 10/18/20      PT SHORT TERM GOAL #2   Title Pt will decrease TUG time to no less than 15 seconds with LRAD in order to improve mobility    Baseline 19 seconds w/ FWW    Time 3    Period Weeks    Status New    Target Date 10/18/20             PT Long Term Goals - 09/27/20 0855      PT LONG TERM GOAL #1   Title Pt will improve FOTO functional score to no less than 60% in order to improve confidence and functional ability    Baseline 23% function    Time 6    Period Weeks    Status New    Target Date 11/08/20      PT LONG TERM GOAL #2   Title Pt will increase reps in 30 Second STS to no less than 12 in order to show improved functional mobility    Baseline 8    Time 6    Period Weeks    Status New    Target Date 11/08/20      PT LONG  TERM GOAL #3   Title Pt will be able to ambulate 860ft with LRAD in order to improve safety with community navigation    Baseline 436ft with FWW    Time 6    Period Weeks    Status New    Target Date 11/08/20                 Plan - 10/10/20 1247    Clinical Impression Statement Pt was once again able to complete prescribed exercises with no adverse effect. He continues to progress well with therapy, showing improved gait with SPC and ability to progress to higher level strengthening exercises. He continues to have some deficits in gait secondary to AD use and overall mobility limitations secondary to L LE pain. Pt was able to meet TUG goal with [facility] as AD. Will continue to progress as tolerated with emphasis on improving ambulation without SPC.    PT Frequency 1x / week    PT Duration 6 weeks    PT Treatment/Interventions ADLs/Self Care Home Management;Electrical Stimulation;Moist Heat;Gait training;Stair training;Functional mobility training;Therapeutic activities;Therapeutic exercise;Neuromuscular re-education;Patient/family education;Manual techniques    PT Next Visit Plan progress gait without AD; progress LE strengthening as able    PT Home Exercise Plan Access Code: [email or web address]           Patient will benefit from skilled therapeutic intervention in order to improve the following deficits and impairments:  Abnormal gait,Decreased balance,Decreased activity tolerance,Decreased endurance,Decreased range of motion,Decreased strength,Difficulty walking,Pain  Visit Diagnosis: Muscle weakness (generalized)  Other abnormalities of gait and mobility  Unsteadiness on feet  Pain in left leg     Problem List Patient Active Problem List   Diagnosis Date Noted  . Femur fracture (HCC) 09/09/2020  . Trimalleolar fracture of ankle, closed 07/27/2014    Eloy End, PT, DPT 10/10/20  1:28 PM  [facility] [address] [place], [place], [address] Phone: [phone]   Fax:  [phone]  Name: Douglas Snyder MRN: [id number] Date of Birth: 1971-03-21

## 2020-10-17 ENCOUNTER — Ambulatory Visit: Payer: Self-pay

## 2020-10-24 ENCOUNTER — Ambulatory Visit: Payer: Self-pay | Admitting: Physical Therapy

## 2020-10-24 ENCOUNTER — Encounter: Payer: Self-pay | Admitting: Physical Therapy

## 2020-10-24 ENCOUNTER — Other Ambulatory Visit: Payer: Self-pay

## 2020-10-24 DIAGNOSIS — R2681 Unsteadiness on feet: Secondary | ICD-10-CM

## 2020-10-24 DIAGNOSIS — M6281 Muscle weakness (generalized): Secondary | ICD-10-CM

## 2020-10-24 DIAGNOSIS — R2689 Other abnormalities of gait and mobility: Secondary | ICD-10-CM

## 2020-10-24 DIAGNOSIS — M79605 Pain in left leg: Secondary | ICD-10-CM

## 2020-10-24 NOTE — Therapy (Signed)
[facility] [address] [place], [place], [address] Phone: [phone]   Fax:  [phone]  Physical Therapy Treatment  Patient Details  Name: Douglas Snyder MRN: [id number] Date of Birth: 03/26/1971 Referring Provider (PT): Juliet Rude, [place]   Encounter Date: 10/24/2020   PT End of Session - 10/24/20 1221    Visit Number 4    Number of Visits 7    Date for PT Re-Evaluation 11/08/20    Authorization Type Self Pay - FOTO 6th and 10th visit    PT Start Time 1216    PT Stop Time 1258    PT Time Calculation (min) 42 min    Activity Tolerance Patient tolerated treatment well    Behavior During Therapy [employer] for tasks assessed/performed           Past Medical History:  Diagnosis Date  . Medical history non-contributory     Past Surgical History:  Procedure Laterality Date  . ANKLE ARTHODESIS W/ ARTHROSCOPY    . ANKLE SURGERY Bilateral   . FEMUR IM NAIL Left 09/09/2020   Procedure: INTRAMEDULLARY (IM) NAIL FEMORAL;  Surgeon: Yolonda Kida, MD;  Location: [facility] OR;  Service: Orthopedics;  Laterality: Left;  . ORIF ANKLE FRACTURE  2012   right  . ORIF ANKLE FRACTURE Left 07/27/2014   Procedure: OPEN REDUCTION INTERNAL FIXATION (ORIF) LEFT ANKLE FRACTURE;  Surgeon: Sheral Apley, MD;  Location: [facility];  Service: Orthopedics;  Laterality: Left;    There were no vitals filed for this visit.   Subjective Assessment - 10/24/20 1217    Subjective Patient reports no pain in the left leg but is having some left hip discomfort. States exercise are going well, and he doesn't use the cane when around the house but continues to use if when going out and sometimes in the morning when he is feeling a little more stiff.    Patient Stated Goals Pt would like to decrease    Currently in Pain? No/denies              [place] PT Assessment - 10/24/20 0001      AROM   AROM Assessment Site Knee     Right/Left Knee Right;Left    Right Knee Flexion 135    Left Knee Flexion 125      Strength   Right Knee Flexion 5/5    Right Knee Extension 5/5    Left Knee Flexion 4/5    Left Knee Extension 4+/5      Ambulation/Gait   Gait Comments Coxalgic on left without [facility]                         [facility] Adult PT Treatment/Exercise - 10/24/20 0001      Exercises   Exercises Knee/Hip      Knee/Hip Exercises: Aerobic   Nustep L5 x 5 min with LE while taking subjective      Knee/Hip Exercises: Machines for Strengthening   Total Gym Leg Press 65# 3 x 8      Knee/Hip Exercises: Standing   Heel Raises 2 sets;15 reps    Heel Raises Limitations 3#    Knee Flexion 2 sets;10 reps    Knee Flexion Limitations 3#    Hip Flexion 2 sets;20 reps    Hip Flexion Limitations 3#    Hip Abduction 2 sets;10 reps    Abduction Limitations 3#    Hip Extension  2 sets;10 reps    Extension Limitations 3#    Other Standing Knee Exercises Tandem stance 3 x 30 sec      Knee/Hip Exercises: Seated   Long Arc Quad 3 sets;10 reps    Long Arc Quad Weight 4 lbs.    Long [employer] Limitations 3rd set performed with green band for HEP demo    Hamstring Curl 10 reps    Hamstring Limitations green band for HEP demo    Sit to [employer] 2 sets;10 reps   holding 10#     Knee/Hip Exercises: Supine   Bridges 10 reps    Straight Leg Raises 10 reps                  PT Education - 10/24/20 1221    Education Details HEP update, FOTO review    Person(s) Educated Patient    Methods Explanation;Demonstration;Verbal cues;Handout    Comprehension Verbalized understanding;Need further instruction;Returned demonstration;Verbal cues required            PT Short Term Goals - 10/24/20 1331      PT SHORT TERM GOAL #1   Title Pt will be compliant and knowledgeable with 90% of HEP    Baseline patient reports inconsistency with exercises    Time 3    Period Weeks    Status On-going    Target Date 10/18/20       PT SHORT TERM GOAL #2   Title Pt will decrease TUG time to no less than 15 seconds with LRAD in order to improve mobility    Baseline 10 sec assessed on 10/10/20    Time 3    Period Weeks    Status Achieved    Target Date 10/18/20             PT Long Term Goals - 09/27/20 0855      PT LONG TERM GOAL #1   Title Pt will improve FOTO functional score to no less than 60% in order to improve confidence and functional ability    Baseline 23% function    Time 6    Period Weeks    Status New    Target Date 11/08/20      PT LONG TERM GOAL #2   Title Pt will increase reps in 30 Second STS to no less than 12 in order to show improved functional mobility    Baseline 8    Time 6    Period Weeks    Status New    Target Date 11/08/20      PT LONG TERM GOAL #3   Title Pt will be able to ambulate 829ft with LRAD in order to improve safety with community navigation    Baseline 416ft with FWW    Time 6    Period Weeks    Status New    Target Date 11/08/20                 Plan - 10/24/20 1221    Clinical Impression Statement Patient tolerated therapy well with no adverse effects. Therapy focused on continued strengthening mostly in standing position but incorporated leg press with good tolerance. He did report slight increase in left hip discomfort with exercises but this resolved at the completion of exercise. Incorporated tandem balance exercise and patient exhibited greater difficulty with left foot back. Updated HEP to progress strengthening with good tolerance. Patient encouraged to continue walking without SPC unless going longer distances. Patient would benefit from  continued skilled PT to progress strength and mobility in order to improve walking without AD and maximize functional ability.    PT Treatment/Interventions ADLs/Self Care Home Management;Electrical Stimulation;Moist Heat;Gait training;Stair training;Functional mobility training;Therapeutic  activities;Therapeutic exercise;Neuromuscular re-education;Patient/family education;Manual techniques    PT Next Visit Plan progress gait without AD; progress LE strengthening as able    PT Home Exercise Plan Access Code: [email or web address]    Consulted and Agree with Plan of Care Patient           Patient will benefit from skilled therapeutic intervention in order to improve the following deficits and impairments:  Abnormal gait,Decreased balance,Decreased activity tolerance,Decreased endurance,Decreased range of motion,Decreased strength,Difficulty walking,Pain  Visit Diagnosis: Muscle weakness (generalized)  Pain in left leg  Other abnormalities of gait and mobility  Unsteadiness on feet     Problem List Patient Active Problem List   Diagnosis Date Noted  . Femur fracture (HCC) 09/09/2020  . Trimalleolar fracture of ankle, closed 07/27/2014    Rosana Hoes, PT, DPT, LAT, ATC 10/24/20  1:35 PM Phone: [phone] Fax: [phone]   [facility] [address] [place], [place], [address] Phone: [phone]   Fax:  [phone]  Name: Douglas Snyder MRN: [id number] Date of Birth: 1970-11-30

## 2020-10-24 NOTE — Patient Instructions (Signed)
Access Code: [email or web address] URL: [email or web address] Date: 10/24/2020 Prepared by: Rosana Hoes  Exercises Supine Active Straight Leg Raise - 1 x daily - 7 x weekly - 3 sets - 10 reps Supine Bridge - 1 x daily - 7 x weekly - 3 sets - 10 reps Seated Knee Extension with Resistance - 1 x daily - 7 x weekly - 3 sets - 10 reps Seated Hamstring Curls with Resistance - 1 x daily - 7 x weekly - 3 sets - 10 reps Sit to Stand Without Arm Support - 1 x daily - 7 x weekly - 3 sets - 10 reps Standing Hip Extension with Resistance at Ankles and Counter Support - 1 x daily - 7 x weekly - 3 sets - 10 reps Side Stepping with Resistance at Ankles and Counter Support - 1 x daily - 7 x weekly - 1 sets - 2 laps hold Heel rises with counter support - 1 x daily - 7 x weekly - 3 sets - 15 reps Tandem Stance - 1 x daily - 7 x weekly - 3 reps - 30 hold

## 2020-10-31 ENCOUNTER — Ambulatory Visit: Payer: Self-pay | Attending: Physician Assistant

## 2020-10-31 ENCOUNTER — Other Ambulatory Visit: Payer: Self-pay

## 2020-10-31 DIAGNOSIS — R2689 Other abnormalities of gait and mobility: Secondary | ICD-10-CM | POA: Insufficient documentation

## 2020-10-31 DIAGNOSIS — R2681 Unsteadiness on feet: Secondary | ICD-10-CM | POA: Insufficient documentation

## 2020-10-31 DIAGNOSIS — M6281 Muscle weakness (generalized): Secondary | ICD-10-CM | POA: Insufficient documentation

## 2020-10-31 DIAGNOSIS — M79605 Pain in left leg: Secondary | ICD-10-CM | POA: Insufficient documentation

## 2020-10-31 NOTE — Therapy (Addendum)
[place], [place], [address] Phone: [phone]   Fax:  [phone]  Physical Therapy Treatment / Discharge  Patient Details  Name: Douglas Snyder MRN: [id number] Date of Birth: [birth year] Referring Provider (PT): Norm Parcel, [place]   Encounter Date: 10/31/2020   PT End of Session - 10/31/20 1236     Visit Number 5    Number of Visits 7    Date for PT Re-Evaluation 11/08/20    Authorization Type Self Pay - FOTO 6th and 10th visit    PT Start Time 1233   arrived late   PT Stop Time 1300    PT Time Calculation (min) 27 min    Activity Tolerance Patient tolerated treatment well    Behavior During Therapy [facility] for tasks assessed/performed             Past Medical History:  Diagnosis Date   Medical history non-contributory     Past Surgical History:  Procedure Laterality Date   ANKLE ARTHODESIS W/ ARTHROSCOPY     ANKLE SURGERY Bilateral    FEMUR IM NAIL Left 09/09/2020   Procedure: INTRAMEDULLARY (IM) NAIL FEMORAL;  Surgeon: Nicholes Stairs, MD;  Location: [place] [facility];  Service: Orthopedics;  Laterality: Left;   ORIF ANKLE FRACTURE  2012   right   ORIF ANKLE FRACTURE Left 07/27/2014   Procedure: OPEN REDUCTION INTERNAL FIXATION (ORIF) LEFT ANKLE FRACTURE;  Surgeon: Renette Butters, MD;  Location: [place];  Service: Orthopedics;  Laterality: Left;    There were no vitals filed for this visit.   Subjective Assessment - 10/31/20 1237     Subjective Pt arrives late secondary to not having transportation scheduled. He states exercises have been going well at home with no adverse effect. Pt is ready to begin PT treatment at this time.    Currently in Pain? Yes    Pain Score 2     Pain Location Hip    Pain Orientation Left                               [facility] Adult PT Treatment/Exercise - 10/31/20 0001       Knee/Hip Exercises: Standing   Hip  Abduction 2 sets;10 reps    Abduction Limitations blue tband    Hip Extension 2 sets;10 reps    Extension Limitations blue tband    Lateral Step Up 10 reps;Step Height: 8";Both    Forward Step Up 10 reps;Step Height: 8";Both    Functional Squat 2 sets;10 reps    Functional Squat Limitations holding freemotion handle    Other Standing Knee Exercises lateral walk blue tband x 3 laps at counter      Knee/Hip Exercises: Seated   Sit to Sand 2 sets;10 reps   holding 10lb DB     Knee/Hip Exercises: Supine   Bridges with [employer] 2 sets;15 reps   blue tband   Straight Leg Raises 2 sets;10 reps;Both    Straight Leg Raises Limitations 2.5lbs      Knee/Hip Exercises: Sidelying   Clams 2x15 L LE blue tband                      PT Short Term Goals - 10/24/20 1331       PT SHORT TERM GOAL #1   Title Pt will be compliant and knowledgeable with 90% of HEP  Baseline patient reports inconsistency with exercises    Time 3    Period Weeks    Status On-going    Target Date 10/18/20      PT SHORT TERM GOAL #2   Title Pt will decrease TUG time to no less than 15 seconds with LRAD in order to improve mobility    Baseline 10 sec assessed on 10/10/20    Time 3    Period Weeks    Status Achieved    Target Date 10/18/20               PT Long Term Goals - 09/27/20 0855       PT LONG TERM GOAL #1   Title Pt will improve FOTO functional score to no less than 60% in order to improve confidence and functional ability    Baseline 23% function    Time 6    Period Weeks    Status New    Target Date 11/08/20      PT LONG TERM GOAL #2   Title Pt will increase reps in 30 Second STS to no less than 12 in order to show improved functional mobility    Baseline 8    Time 6    Period Weeks    Status New    Target Date 11/08/20      PT LONG TERM GOAL #3   Title Pt will be able to ambulate 800ft with LRAD in order to improve safety with community navigation    Baseline 400ft  with FWW    Time 6    Period Weeks    Status New    Target Date 11/08/20                   Plan - 10/31/20 1352     Clinical Impression Statement Pt once again tolerated treatment well with no adverse effects noted. Pt again was able to progress standing exercises but session was limited by decreased time due to transportation issues. He has progressed well with therapy overall and may soon be a good candidate for discharge from skilled PT services. Will assess response to last treatment session and goals for potential d/c.    PT Treatment/Interventions ADLs/Self Care Home Management;Electrical Stimulation;Moist Heat;Gait training;Stair training;Functional mobility training;Therapeutic activities;Therapeutic exercise;Neuromuscular re-education;Patient/family education;Manual techniques    PT Next Visit Plan assess goals, address HEP    PT Home Exercise Plan Access Code: [email or web address]    Consulted and Agree with Plan of Care Patient             Patient will benefit from skilled therapeutic intervention in order to improve the following deficits and impairments:  Abnormal gait,Decreased balance,Decreased activity tolerance,Decreased endurance,Decreased range of motion,Decreased strength,Difficulty walking,Pain  Visit Diagnosis: Muscle weakness (generalized)  Pain in left leg  Other abnormalities of gait and mobility  Unsteadiness on feet     Problem List Patient Active Problem List   Diagnosis Date Noted   Femur fracture (HCC) 09/09/2020   Trimalleolar fracture of ankle, closed 07/27/2014    David C Stroup, PT, DPT 10/31/20 1:54 PM  [facility] [address] [place], [place], [address] Phone: [phone]   Fax:  [phone]  Name: Douglas Snyder MRN: [id number] Date of Birth: 03/23/1971   PHYSICAL THERAPY DISCHARGE SUMMARY  Visits from Start of Care: 5  Current functional level related to goals /  functional outcomes: See above   Remaining deficits: See above   Education /   Equipment: HEP   Patient agrees to discharge. Patient goals were partially met. Patient is being discharged due to not returning since the last visit.

## 2020-11-07 ENCOUNTER — Inpatient Hospital Stay: Payer: Self-pay | Admitting: Physician Assistant
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
## 2020-11-08 ENCOUNTER — Ambulatory Visit: Payer: Self-pay | Admitting: Physical Therapy

## 2020-12-13 ENCOUNTER — Ambulatory Visit (INDEPENDENT_AMBULATORY_CARE_PROVIDER_SITE_OTHER): Payer: Self-pay | Admitting: Physician Assistant

## 2020-12-13 ENCOUNTER — Inpatient Hospital Stay: Payer: Self-pay | Admitting: Physician Assistant

## 2020-12-13 ENCOUNTER — Encounter: Payer: Self-pay | Admitting: Physician Assistant

## 2020-12-13 ENCOUNTER — Other Ambulatory Visit: Payer: Self-pay

## 2020-12-13 VITALS — BP 155/98 | HR 75 | Temp 97.9°F | Resp 18 | Ht 68.62 in | Wt 172.8 lb

## 2020-12-13 DIAGNOSIS — Z09 Encounter for follow-up examination after completed treatment for conditions other than malignant neoplasm: Secondary | ICD-10-CM

## 2020-12-13 DIAGNOSIS — D649 Anemia, unspecified: Secondary | ICD-10-CM

## 2020-12-13 DIAGNOSIS — E871 Hypo-osmolality and hyponatremia: Secondary | ICD-10-CM

## 2020-12-13 DIAGNOSIS — I10 Essential (primary) hypertension: Secondary | ICD-10-CM

## 2020-12-13 MED ORDER — LISINOPRIL 10 MG PO TABS: 10.0000 mg | ORAL_TABLET | Freq: Every day | ORAL | 3 refills | Status: AC

## 2020-12-13 MED ORDER — METHOCARBAMOL 750 MG PO TABS: 750.0000 mg | ORAL_TABLET | Freq: Three times a day (TID) | ORAL | 0 refills | Status: AC | PRN
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
## 2020-12-14 LAB — CBC WITH DIFFERENTIAL/PLATELET
Basophils Absolute: 0.1 10*3/uL (ref 0.0–0.2)
Basos: 1 %
EOS (ABSOLUTE): 0.2 10*3/uL (ref 0.0–0.4)
Eos: 2 %
Hematocrit: 48.5 % (ref 37.5–51.0)
Hemoglobin: 16 g/dL (ref 13.0–17.7)
Immature Grans (Abs): 0 10*3/uL (ref 0.0–0.1)
Immature Granulocytes: 0 %
Lymphocytes Absolute: 2.7 10*3/uL (ref 0.7–3.1)
Lymphs: 31 %
MCH: 33.4 pg — ABNORMAL HIGH (ref 26.6–33.0)
MCHC: 33 g/dL (ref 31.5–35.7)
MCV: 101 fL — ABNORMAL HIGH (ref 79–97)
Monocytes Absolute: 0.5 10*3/uL (ref 0.1–0.9)
Monocytes: 5 %
Neutrophils Absolute: 5.2 10*3/uL (ref 1.4–7.0)
Neutrophils: 61 %
Platelets: 144 10*3/uL — ABNORMAL LOW (ref 150–450)
RBC: 4.79 x10E6/uL (ref 4.14–5.80)
RDW: 12.2 % (ref 11.6–15.4)
WBC: 8.6 10*3/uL (ref 3.4–10.8)

## 2020-12-14 LAB — CMP14+EGFR
ALT: 15 IU/L (ref 0–44)
AST: 21 IU/L (ref 0–40)
Albumin/Globulin Ratio: 1.3 (ref 1.2–2.2)
Albumin: 4.4 g/dL (ref 4.0–5.0)
Alkaline Phosphatase: 269 IU/L — ABNORMAL HIGH (ref 44–121)
BUN/Creatinine Ratio: 6 — ABNORMAL LOW (ref 9–20)
BUN: 5 mg/dL — ABNORMAL LOW (ref 6–24)
Bilirubin Total: 0.3 mg/dL (ref 0.0–1.2)
CO2: 26 mmol/L (ref 20–29)
Calcium: 10 mg/dL (ref 8.7–10.2)
Chloride: 98 mmol/L (ref 96–106)
Creatinine, Ser: 0.88 mg/dL (ref 0.76–1.27)
Globulin, Total: 3.5 g/dL (ref 1.5–4.5)
Glucose: 86 mg/dL (ref 65–99)
Potassium: 4.4 mmol/L (ref 3.5–5.2)
Sodium: 140 mmol/L (ref 134–144)
Total Protein: 7.9 g/dL (ref 6.0–8.5)
eGFR: 106 mL/min/{1.73_m2} (ref >59)

## 2020-12-14 NOTE — Progress Notes (Signed)
See labs 

## 2020-12-20 ENCOUNTER — Other Ambulatory Visit: Payer: Self-pay | Admitting: Physician Assistant

## 2020-12-27 ENCOUNTER — Ambulatory Visit: Payer: Self-pay | Admitting: Pharmacist
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
## 2021-03-12 ENCOUNTER — Encounter: Payer: Self-pay | Admitting: Family

## 2021-03-12 DIAGNOSIS — Z7689 Persons encountering health services in other specified circumstances: Secondary | ICD-10-CM
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
